# Patient Record
Sex: Male | Born: 1985
Health system: Southern US, Community
[De-identification: ages and names within clinical notes are randomized; demographics above are authoritative.]

## PROBLEM LIST (undated history)

## (undated) ENCOUNTER — Emergency Department (HOSPITAL_BASED_OUTPATIENT_CLINIC_OR_DEPARTMENT_OTHER): Payer: Self-pay

## (undated) DIAGNOSIS — M549 Dorsalgia, unspecified: Secondary | ICD-10-CM

## (undated) DIAGNOSIS — M5410 Radiculopathy, site unspecified: Secondary | ICD-10-CM

## (undated) DIAGNOSIS — T79A0XA Compartment syndrome, unspecified, initial encounter: Secondary | ICD-10-CM

## (undated) DIAGNOSIS — F329 Major depressive disorder, single episode, unspecified: Secondary | ICD-10-CM

## (undated) DIAGNOSIS — L409 Psoriasis, unspecified: Secondary | ICD-10-CM

## (undated) DIAGNOSIS — H3553 Other dystrophies primarily involving the sensory retina: Secondary | ICD-10-CM

## (undated) DIAGNOSIS — F32A Depression, unspecified: Secondary | ICD-10-CM

## (undated) HISTORY — DX: Compartment syndrome, unspecified, initial encounter: T79.A0XA

## (undated) HISTORY — PX: OTHER SURGICAL HISTORY: SHX169

## (undated) HISTORY — PX: BACK SURGERY: SHX140

## (undated) HISTORY — DX: Dorsalgia, unspecified: M54.9

## (undated) HISTORY — DX: Radiculopathy, site unspecified: M54.10

## (undated) HISTORY — PX: HAND SURGERY: SHX662

---
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
## 2006-07-15 ENCOUNTER — Ambulatory Visit: Payer: Self-pay | Admitting: Internal Medicine

## 2006-07-15 LAB — CONVERTED CEMR LAB
Basophils Relative: 0 % (ref 0.0–1.0)
Cholesterol: 112 mg/dL (ref 0–200)
Eosinophils Relative: 2.2 % (ref 0.0–5.0)
Glucose, Bld: 79 mg/dL (ref 70–99)
HCT: 45.5 % (ref 39.0–52.0)
Hemoglobin: 15.9 g/dL (ref 13.0–17.0)
Lymphocytes Relative: 23 % (ref 12.0–46.0)
MCHC: 35 g/dL (ref 30.0–36.0)
MCV: 92 fL (ref 78.0–100.0)
Monocytes Relative: 8.6 % (ref 3.0–11.0)
Neutrophils Relative %: 66.2 % (ref 43.0–77.0)
Platelets: 168 10*3/uL (ref 150–400)
RBC: 4.94 M/uL (ref 4.22–5.81)
RDW: 12 % (ref 11.5–14.6)
TSH: 0.85 microintl units/mL (ref 0.35–5.50)
WBC: 3.5 10*3/uL — ABNORMAL LOW (ref 4.5–10.5)

## 2006-09-30 ENCOUNTER — Ambulatory Visit: Payer: Self-pay | Admitting: Internal Medicine

## 2007-02-16 ENCOUNTER — Encounter: Payer: Self-pay | Admitting: Internal Medicine

## 2007-02-24 ENCOUNTER — Telehealth (INDEPENDENT_AMBULATORY_CARE_PROVIDER_SITE_OTHER): Payer: Self-pay | Admitting: *Deleted

## 2007-03-04 ENCOUNTER — Ambulatory Visit: Payer: Self-pay | Admitting: Internal Medicine

## 2007-03-04 DIAGNOSIS — H3530 Unspecified macular degeneration: Secondary | ICD-10-CM | POA: Insufficient documentation

## 2007-12-01 ENCOUNTER — Telehealth (INDEPENDENT_AMBULATORY_CARE_PROVIDER_SITE_OTHER): Payer: Self-pay | Admitting: *Deleted

## 2009-01-05 ENCOUNTER — Ambulatory Visit: Payer: Self-pay | Admitting: Family Medicine

## 2009-08-01 ENCOUNTER — Encounter: Payer: Self-pay | Admitting: Internal Medicine

## 2009-09-17 ENCOUNTER — Encounter: Payer: Self-pay | Admitting: Internal Medicine

## 2009-11-28 ENCOUNTER — Encounter: Payer: Self-pay | Admitting: Internal Medicine

## 2010-06-25 ENCOUNTER — Telehealth (INDEPENDENT_AMBULATORY_CARE_PROVIDER_SITE_OTHER): Payer: Self-pay | Admitting: *Deleted

## 2010-07-02 NOTE — Letter (Signed)
Summary: onychomycosis, topical Rx--- Dermatology  [facility]   Imported By: Lanelle Bal 12/11/2009 13:54:10  _____________________________________________________________________  External Attachment:    Type:   Image     Comment:   External Document

## 2010-07-02 NOTE — Letter (Signed)
Summary: [facility]  [facility]   Imported By: Lanelle Bal 10/09/2009 09:15:10  _____________________________________________________________________  External Attachment:    Type:   Image     Comment:   External Document

## 2010-07-02 NOTE — Letter (Signed)
Summary: [facility]  [facility]   Imported By: Lanelle Bal 08/14/2009 12:56:15  _____________________________________________________________________  External Attachment:    Type:   Image     Comment:   External Document

## 2010-07-02 NOTE — Letter (Signed)
Summary: [facility]  [facility]   Imported By: Lanelle Bal 12/19/2009 10:25:51  _____________________________________________________________________  External Attachment:    Type:   Image     Comment:   External Document

## 2010-07-03 ENCOUNTER — Encounter: Payer: Self-pay | Admitting: Internal Medicine

## 2010-07-03 ENCOUNTER — Other Ambulatory Visit: Payer: Self-pay | Admitting: Internal Medicine

## 2010-07-03 ENCOUNTER — Encounter (INDEPENDENT_AMBULATORY_CARE_PROVIDER_SITE_OTHER): Payer: BC Managed Care – PPO | Admitting: Internal Medicine

## 2010-07-03 DIAGNOSIS — Z0000 Encounter for general adult medical examination without abnormal findings: Secondary | ICD-10-CM

## 2010-07-03 LAB — CBC WITH DIFFERENTIAL/PLATELET
Basophils Absolute: 0 10*3/uL (ref 0.0–0.1)
Basophils Relative: 0.5 % (ref 0.0–3.0)
Eosinophils Absolute: 0.2 10*3/uL (ref 0.0–0.7)
Eosinophils Relative: 3.5 % (ref 0.0–5.0)
HCT: 46.7 % (ref 39.0–52.0)
Hemoglobin: 16.4 g/dL (ref 13.0–17.0)
Lymphocytes Relative: 21.6 % (ref 12.0–46.0)
Lymphs Abs: 1.2 10*3/uL (ref 0.7–4.0)
MCHC: 35.1 g/dL (ref 30.0–36.0)
MCV: 92.9 fl (ref 78.0–100.0)
Monocytes Absolute: 0.5 10*3/uL (ref 0.1–1.0)
Monocytes Relative: 8.6 % (ref 3.0–12.0)
Neutro Abs: 3.6 10*3/uL (ref 1.4–7.7)
Neutrophils Relative %: 65.8 % (ref 43.0–77.0)
Platelets: 170 10*3/uL (ref 150.0–400.0)
RBC: 5.02 Mil/uL (ref 4.22–5.81)
RDW: 12.7 % (ref 11.5–14.6)
WBC: 5.5 10*3/uL (ref 4.5–10.5)

## 2010-07-03 LAB — BASIC METABOLIC PANEL
BUN: 17 mg/dL (ref 6–23)
CO2: 30 mEq/L (ref 19–32)
Calcium: 9.4 mg/dL (ref 8.4–10.5)
Chloride: 102 mEq/L (ref 96–112)
Creatinine, Ser: 1.3 mg/dL (ref 0.4–1.5)
GFR: 74.04 mL/min (ref >60.00)
Glucose, Bld: 76 mg/dL (ref 70–99)
Potassium: 4 mEq/L (ref 3.5–5.1)
Sodium: 140 mEq/L (ref 135–145)

## 2010-07-03 LAB — CHOLESTEROL, TOTAL: Cholesterol: 126 mg/dL (ref 0–200)

## 2010-07-10 NOTE — Progress Notes (Signed)
Summary: lmom   patient needs CPX  1/24--has appt=2/1  Phone Note Call from Patient Call back at Work Phone [phone]   Caller: Patient Summary of Call: lmom for patient to call back and schedule visit (CPX) with Dr Paz---patient saw Dr Beverely Low in 2010, but has not seen Dr Drue Novel since 2008---we have received paperwork from [facility] and I explained on voice message that Dr Drue Novel would be unable to fill this form out  because patient hasnt been seen in years Initial call taken by: Jerolyn Shin,  June 25, 2010 9:16 AM  Follow-up for Phone Call        has appt for Wed 2/1 at 1:30---took paperwork to Danielle''s desk Follow-up by: Jerolyn Shin,  July 03, 2010 12:24 PM

## 2010-07-10 NOTE — Assessment & Plan Note (Signed)
Summary: PHYSICAL AND PAPERWORK--[phone]//PH   confirmed with pat...   Vital Signs:  Patient profile:   25 year old male Height:      70 inches (177.80 cm) Weight:      175.50 pounds (79.77 kg) BMI:     25.27 Temp:     98.6 degrees F (37.00 degrees C) oral BP sitting:   110 / 68  (left arm) Cuff size:   regular  Vitals Entered By: Lucious Groves CMA (July 03, 2010 1:25 PM) CC: CPX./kb Is Patient Diabetic? No Pain Assessment Patient in pain? no      Comments Not fasting./kb   History of Present Illness: CPX  paperwork for [employer]  for the last 2 months has occasional pain at the sides of the legs  after running. 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 Resp:  Denies cough and wheezing. GI:  Denies bloody stools, diarrhea, and vomiting. GU:  Denies dysuria and hematuria. Psych:  see #2 in the a/p.  Physical Exam  General:  alert, well-developed, and well-nourished.   Neck:  no masses and no thyromegaly.   Lungs:  normal respiratory effort, no intercostal retractions, no accessory muscle use, and normal breath sounds.   Heart:  normal rate, regular rhythm, no murmur, and no gallop.   Abdomen:  soft, non-tender, no distention, no masses, no guarding, and no rigidity.   Extremities:  no  pretibial edema bilaterally  gait normal Knees normal to inspection and palpation Neurologic:  alert & oriented X3 and gait normal.   Psych:  Cognition and judgment appear intact. Alert and cooperative with normal attention span and concentration.     Impression & Recommendations:  Problem # 1:  ROUTINE GENERAL MEDICAL EXAM@HEALTH  CARE FACL (ICD-V70.0)  rec to bring his immunization records, he likely needs a tetanus shot. He will get records from his mother.  counseled about  diet, exercise, STE  has some B LE pain w/ running: stretching, ADVIL   Orders: Venipuncture (60454) TLB-BMP (Basic Metabolic Panel-BMET) (80048-METABOL) TLB-CBC Platelet - w/Differential (85025-CBCD) TLB-TSH (Thyroid Stimulating Hormone) (84443-TSH) TLB-ALT (SGPT) (84460-ALT) TLB-AST (SGOT) (84450-SGOT) TLB-Cholesterol, Total (82465-CHO) Specimen Handling (09811)  Problem # 2:  MACULAR DEGENERATION (ICD-362.50) see PMH, papaers completed asked him about anxiety , depression ----> he admits to some anx/depression d/t poor vision  encouraged to call or look for help whenever he feels symptoms warrant help   Orders Added: 1)  Venipuncture [36415] 2)  TLB-BMP (Basic Metabolic Panel-BMET) [80048-METABOL] 3)  TLB-CBC Platelet - w/Differential [85025-CBCD] 4)  TLB-TSH (Thyroid Stimulating Hormone) [84443-TSH] 5)  TLB-ALT (SGPT) [84460-ALT] 6)  TLB-AST (SGOT) [84450-SGOT] 7)  TLB-Cholesterol, Total [82465-CHO] 8)  Specimen Handling [99000] 9)  Est. Patient age 70-39 [phone]

## 2010-07-18 NOTE — Letter (Signed)
Summary: [facility]  [facility]   Imported By: Maryln Gottron 07/09/2010 15:20:58  _____________________________________________________________________  External Attachment:    Type:   Image     Comment:   External Document

## 2010-10-18 NOTE — Op Note (Signed)
NAME:  Michael George, Michael George NO.:  [id number]   MEDICAL RECORD NO.:  [id number]                   PATIENT TYPE:  OIB   LOCATION:  2853                                 FACILITY:  [facility]   PHYSICIAN:  Dionne Ano. Everlene Other, M.D.         DATE OF BIRTH:  1986/05/13   DATE OF PROCEDURE:  11/04/2003  DATE OF DISCHARGE:  11/04/2003                                 OPERATIVE REPORT   PREOPERATIVE DIAGNOSIS:  Right index finger metacarpal fracture, midshaft in  nature, displaced with malalignment and poor rotation.   POSTOPERATIVE DIAGNOSIS:  Right index finger metacarpal fracture, midshaft  in nature, displaced with malalignment and poor rotation.   PROCEDURE:  1. Open reduction and internal fixation right index finger metacarpal     fracture with intermedullary rod technique.  2. Stress radiography.   SURGEON:  Dionne Ano. Amanda Pea, M.D.   ASSISTANT:  None.   COMPLICATIONS:  None.   ANESTHESIA:  General LMA anesthesia.   TOURNIQUET TIME:  Less than 20 minutes.   ESTIMATED BLOOD LOSS:  Minimal.   INDICATIONS FOR PROCEDURE:  The patient is a very pleasant 25 year old male  who presents for the above-mentioned diagnosis.  I have counseled him in  regards to the risks and benefits of surgery including the risks of  infection, bleeding, anesthesia, damage to normal structures, and failure to  accomplish his intended goals of relieving symptoms and restoring function.  With this in mind, he decided to proceed.  All of his questions have been  encouraged and answered preoperatively.   OPERATIVE FINDINGS:  The patient had a displaced index finger metacarpal  fracture.  He underwent ORIF without difficulty.  There were no complicating  features.  Excellent stability, splay of the fingers and alignment were  accomplished.   OPERATION IN DETAIL:  The patient was seen by myself and anesthesia and  taken to the operative suite and underwent smooth induction of general  LMA  anesthesia.  Ancef was then given IV for antibiotic prophylaxis.  The  patient underwent prep and drape in the usual sterile fashion about the  right upper extremity and following this, a transverse incision was made  under 250 mmHg tourniquet control about the base of the second CMC joint.  I  dissected down and created a pilot drill hole over the [facility] base and once  this was done, I then threaded a 0.062 blunt-end K-wire down the medullary  shaft and across the fracture site in a reduced position and then seated  this without difficulty.  I pre-bent the wire for prevention of angulation  in the apex-dorsal plane and engaged the wire nicely so that the end would  be pointed towards the palm so as not to encroach upon the extensor  apparatus.  Following this, I then checked the reduction multiple times.  It  was excellent.  I was pleased with the finger alignment, stability, and  overall construct.  I fluoro'd the hand to make sure there were no other  fractures.  There was not to fluoroscopy exam in stress views.  Following  this, I then deflated the tourniquet, irrigated copiously, and closed with  interrupted Prolene.  Marcaine without epinephrine was placed in the skin  for postoperative analgesia and the patient was taken to the recovery room.  He tolerated the procedure well.  There were no complicating features.  All  sponge, needle, and instrument counts were reported as correct.  He was  discharged home on Vicodin and Keflex to return to see me in six days in the  office.                                               Dionne Ano. Everlene Other, M.D.    Nash Mantis  D:  11/04/2003  T:  11/05/2003  Job:  [id number]

## 2011-10-26 ENCOUNTER — Encounter (HOSPITAL_COMMUNITY): Payer: Self-pay | Admitting: Emergency Medicine

## 2011-10-26 ENCOUNTER — Emergency Department (HOSPITAL_COMMUNITY)
Admission: EM | Admit: 2011-10-26 | Discharge: 2011-10-26 | Disposition: A | Discharge status: Home / Self Care | Payer: BC Managed Care – PPO | Attending: Emergency Medicine | Admitting: Emergency Medicine

## 2011-10-26 DIAGNOSIS — R110 Nausea: Secondary | ICD-10-CM | POA: Insufficient documentation

## 2011-10-26 DIAGNOSIS — R6884 Jaw pain: Secondary | ICD-10-CM | POA: Insufficient documentation

## 2011-10-26 DIAGNOSIS — Z79899 Other long term (current) drug therapy: Secondary | ICD-10-CM | POA: Insufficient documentation

## 2011-10-26 DIAGNOSIS — R209 Unspecified disturbances of skin sensation: Secondary | ICD-10-CM | POA: Insufficient documentation

## 2011-10-26 DIAGNOSIS — F411 Generalized anxiety disorder: Secondary | ICD-10-CM | POA: Insufficient documentation

## 2011-10-26 DIAGNOSIS — H3553 Other dystrophies primarily involving the sensory retina: Secondary | ICD-10-CM | POA: Insufficient documentation

## 2011-10-26 DIAGNOSIS — R079 Chest pain, unspecified: Secondary | ICD-10-CM | POA: Insufficient documentation

## 2011-10-26 DIAGNOSIS — R52 Pain, unspecified: Secondary | ICD-10-CM | POA: Insufficient documentation

## 2011-10-26 DIAGNOSIS — M25519 Pain in unspecified shoulder: Secondary | ICD-10-CM | POA: Insufficient documentation

## 2011-10-26 HISTORY — DX: Other dystrophies primarily involving the sensory retina: H35.53

## 2011-10-26 HISTORY — DX: Depression, unspecified: F32.A

## 2011-10-26 HISTORY — DX: Major depressive disorder, single episode, unspecified: F32.9

## 2011-10-26 LAB — TROPONIN I
Troponin I: <0.3 ng/mL (ref <0.30)
Troponin I: <0.3 ng/mL (ref <0.30)

## 2011-10-26 MED ORDER — LORAZEPAM 2 MG/ML IJ SOLN
1.0000 mg | Freq: Once | INTRAMUSCULAR | Status: AC
  Administered 2011-10-26: 1 mg via INTRAVENOUS
  Filled 2011-10-26 (×2): qty 1

## 2011-10-26 MED ORDER — SODIUM CHLORIDE 0.9 % IV SOLN
Freq: Once | INTRAVENOUS | Status: AC
  Administered 2011-10-26: 10 mL/h via INTRAVENOUS

## 2011-10-26 MED ORDER — KETOROLAC TROMETHAMINE 30 MG/ML IJ SOLN
30.0000 mg | Freq: Once | INTRAMUSCULAR | Status: AC
  Administered 2011-10-26: 30 mg via INTRAVENOUS
  Filled 2011-10-26 (×2): qty 1

## 2011-10-26 NOTE — Discharge Instructions (Signed)
Pain of Unknown Etiology (Pain Without a Known Cause) You have come to your caregiver because of pain. Pain can occur in any part of the body. Often there is not a definite cause. If your laboratory (blood or urine) work was normal and x-rays or other studies were normal, your caregiver may treat you without knowing the cause of the pain. An example of this is the headache. Most headaches are diagnosed by taking a history. This means your caregiver asks you questions about your headaches. Your caregiver determines a treatment based on your answers. Usually testing done for headaches is normal. Often testing is not done unless there is no response to medications. Regardless of where your pain is located today, you can be given medications to make you comfortable. If no physical cause of pain can be found, most cases of pain will gradually leave as suddenly as they came.  If you have a painful condition and no reason can be found for the pain, It is importantthat you follow up with your caregiver. If the pain becomes worse or does not go away, it may be necessary to repeat tests and look further for a possible cause.  Only take over-the-counter or prescription medicines for pain, discomfort, or fever as directed by your caregiver.   For the protection of your privacy, test results can not be given over the phone. Make sure you receive the results of your test. Ask as to how these results are to be obtained if you have not been informed. It is your responsibility to obtain your test results.   You may continue all activities unless the activities cause more pain. When the pain lessens, it is important to gradually resume normal activities. Resume activities by beginning slowly and gradually increasing the intensity and duration of the activities or exercise. During periods of severe pain, bed-rest may be helpful. Lay or sit in any position that is comfortable.   Ice used for acute (sudden) conditions may be  effective. Use a large plastic bag filled with ice and wrapped in a towel. This may provide pain relief.   See your caregiver for continued problems. They can help or refer you for exercises or physical therapy if necessary.  If you were given medications for your condition, do not drive, operate machinery or power tools, or sign legal documents for 24 hours. Do not drink alcohol, take sleeping pills, or take other medications that may interfere with treatment. See your caregiver immediately if you have pain that is becoming worse and not relieved by medications. Document Released: 02/11/2001 Document Revised: 05/08/2011 Document Reviewed: 05/19/2005 Exit[employer] Patient Information 2012 [employer]. 

## 2011-10-26 NOTE — ED Provider Notes (Signed)
History     CSN: [id number]  Arrival date & time 10/26/11  0051   First MD Initiated Contact with Patient 10/26/11 0112      Chief Complaint  Patient presents with  . Jaw Pain    (Consider location/radiation/quality/duration/timing/severity/associated sxs/prior treatment) HPI This is a 26 year old white male who was taking a shower about an hour and a half ago. He had the sudden onset of pain at the end of his left mandible. The pain is moderate to severe, localized and worse with palpation. He subsequently developed a burning pain in his left chest, and a vague pain in his left shoulder. The pain in his chest is worse with palpation. It is not accompanied by dyspnea. He has had nausea but no vomiting. He states he is very anxious and has some paresthesias of the left arm.  Past Medical History  Diagnosis Date  . Depression   . Stargardt's disease     Past Surgical History  Procedure Date  . Back surgery     No family history on file.  History  Substance Use Topics  . Smoking status: Never Smoker   . Smokeless tobacco: Not on file  . Alcohol Use: No      Review of Systems  All other systems reviewed and are negative.    Allergies  Review of patient's allergies indicates no known allergies.  Home Medications   Current Outpatient Rx  Name Route Sig Dispense Refill  . BUPROPION HCL ER (SR) 150 MG PO TB12 Oral Take 150 mg by mouth daily.    . DULOXETINE HCL 30 MG PO CPEP Oral Take 30 mg by mouth daily.      BP 115/82  Pulse 76  Temp(Src) 98 F (36.7 C) (Oral)  Resp 16  Wt 175 lb (79.379 kg)  SpO2 100%  Physical Exam General: Well-developed, well-nourished male in no acute distress; appearance consistent with age of record HENT: normocephalic, atraumatic; moderate to severe point tenderness at the left mandibular angle without deformity, swelling orerythema Eyes: pupils equal round and reactive to light; extraocular muscles intact Neck: supple Heart:  regular rate and rhythm Lungs: clear to auscultation bilaterally Chest: Moderate to severe left parasternal tenderness Abdomen: soft; nondistended Extremities: No deformity; full range of motion; pulses normal; minimal left shoulder tenderness Neurologic: Awake, alert and oriented; motor function intact in all extremities and symmetric; no facial droop Skin: Warm and dry Psychiatric: Anxious    ED Course  Procedures (including critical care time)     MDM   Nursing notes and vitals signs, including pulse oximetry, reviewed.  Summary of this visit's results, reviewed by myself:  Labs:  Results for orders placed during the hospital encounter of 10/26/11  TROPONIN I      Component Value Range   Troponin I <0.30  <0.30 (ng/mL)  TROPONIN I      Component Value Range   Troponin I <0.30  <0.30 (ng/mL)     EKG Interpretation:  Date & Time: 10/26/2011 1:08 AM  Rate: 79  Rhythm: normal sinus rhythm  QRS Axis: right  Intervals: normal  ST/T Wave abnormalities: normal  Conduction Disutrbances:none  Narrative Interpretation: Q waves anterolaterally  Old EKG Reviewed: none available  3:11 AM Pain improved after IV Toradol and Ativan.  4:19 AM Continues to improve. Source of pain is not clear at this time. He was advised to return for worsening or changing symptoms.       Hanley Seamen, MD 10/26/11 [phone]

## 2011-10-26 NOTE — ED Notes (Signed)
Patient is alert and oriented x3.  He was given DC instructions and follow up visit instructions.  Patient gave verbal understanding.  He was DC ambulatory under his own power to home.  V/S stable.  He was not showing any signs of distress on DC 

## 2011-10-26 NOTE — ED Notes (Signed)
Pt alert, nad, c/o left chest pain, radiated to left jaw, c/o nausea, onset this evening, denies hx, resp even unlabored, skin pwd

## 2011-10-26 NOTE — ED Notes (Signed)
MD at bedside. 

## 2011-11-04 ENCOUNTER — Ambulatory Visit (INDEPENDENT_AMBULATORY_CARE_PROVIDER_SITE_OTHER): Payer: BC Managed Care – PPO | Admitting: Internal Medicine

## 2011-11-04 ENCOUNTER — Encounter: Payer: Self-pay | Admitting: Internal Medicine

## 2011-11-04 ENCOUNTER — Ambulatory Visit (INDEPENDENT_AMBULATORY_CARE_PROVIDER_SITE_OTHER)
Admission: RE | Admit: 2011-11-04 | Discharge: 2011-11-04 | Disposition: A | Discharge status: Home / Self Care | Payer: BC Managed Care – PPO | Source: Ambulatory Visit | Attending: Internal Medicine | Admitting: Internal Medicine

## 2011-11-04 VITALS — BP 112/78 | HR 64 | Temp 97.7°F | Wt 179.0 lb

## 2011-11-04 DIAGNOSIS — R0789 Other chest pain: Secondary | ICD-10-CM

## 2011-11-04 DIAGNOSIS — R079 Chest pain, unspecified: Secondary | ICD-10-CM

## 2011-11-04 NOTE — Assessment & Plan Note (Addendum)
Atypical chest pain, review of systems not pointing to any specific etiology. He is tender at the epigastrium on exam. EKG today with no acute changes and no evidence of pericarditis. Plan: PPIs trial, CXR Patient to call in 2 weeks if not better,  call anytime if symptoms increase

## 2011-11-04 NOTE — Progress Notes (Signed)
  Subjective:    Patient ID: Michael George, male    DOB: September 12, 1985, 26 y.o.   MRN: [id number]  HPI ER followup Michael George to the ER 10/26/2011 with acute onset of left mandible and left chest pain. Troponin was negative, EKG with no acute changes. the jaw pain essentially is gone. He continue with chest pain, it is left-sided, no radiation, frequent episodes that last few minutes throughout the day, no clear-cut triggers specifically no worse by bending his torso or eating. There is no associated nausea, sweats, shortness of breath. In the ER he was also anxious but mostly because the symptoms.   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 No recent dental pain or dental treatments. Denies actual abdominal pain, GERD symptoms. No  rash at the chest No recent airplane trips,no pain or swelling @ calves    Objective:   Physical Exam  General -- alert, well-developed, and well-nourished.   Neck --no thyromegaly  Lungs -- normal respiratory effort, no intercostal retractions, no accessory muscle use, and normal breath sounds.   Heart-- normal rate, regular rhythm, no murmur, and no gallop. Chest wall nontender to palpation   Abdomen-- not distended, soft, slightly tender in the epigastric area without mass or rebound Extremities-- no pretibial edema bilaterally, calve symmetric Neurologic-- alert & oriented X3 and strength normal in all extremities. Psych-- Cognition and judgment  appear intact. Alert and cooperative with normal attention span and concentration.  not anxious appearing and not depressed appearing.       Assessment & Plan:

## 2011-11-04 NOTE — Patient Instructions (Signed)
Please get your x-ray at the other [place]  office located at: [address] [place], across from [facility].  Please go to the basement, this is a walk-in facility, they are open from 8:30 to 5:30 PM. Phone number [phone]. ----- Take over-the-counter Prilosec 20 mg one before breakfast for the next 2 weeks Call anytime if the symptoms increase or if you are not better in 2 weeks

## 2011-11-11 ENCOUNTER — Encounter: Payer: Self-pay | Admitting: *Deleted

## 2012-06-02 DIAGNOSIS — L409 Psoriasis, unspecified: Secondary | ICD-10-CM

## 2012-06-02 HISTORY — DX: Psoriasis, unspecified: L40.9

## 2012-06-18 ENCOUNTER — Encounter (HOSPITAL_BASED_OUTPATIENT_CLINIC_OR_DEPARTMENT_OTHER): Payer: Self-pay | Admitting: Emergency Medicine

## 2012-06-18 ENCOUNTER — Emergency Department (HOSPITAL_BASED_OUTPATIENT_CLINIC_OR_DEPARTMENT_OTHER)
Admission: EM | Admit: 2012-06-18 | Discharge: 2012-06-18 | Disposition: A | Discharge status: Home / Self Care | Payer: BC Managed Care – PPO | Attending: Emergency Medicine | Admitting: Emergency Medicine

## 2012-06-18 ENCOUNTER — Emergency Department (HOSPITAL_BASED_OUTPATIENT_CLINIC_OR_DEPARTMENT_OTHER): Payer: BC Managed Care – PPO

## 2012-06-18 DIAGNOSIS — Y93B9 Activity, other involving muscle strengthening exercises: Secondary | ICD-10-CM | POA: Insufficient documentation

## 2012-06-18 DIAGNOSIS — S62329A Displaced fracture of shaft of unspecified metacarpal bone, initial encounter for closed fracture: Secondary | ICD-10-CM | POA: Insufficient documentation

## 2012-06-18 DIAGNOSIS — W1809XA Striking against other object with subsequent fall, initial encounter: Secondary | ICD-10-CM | POA: Insufficient documentation

## 2012-06-18 DIAGNOSIS — Z872 Personal history of diseases of the skin and subcutaneous tissue: Secondary | ICD-10-CM | POA: Insufficient documentation

## 2012-06-18 DIAGNOSIS — Z79899 Other long term (current) drug therapy: Secondary | ICD-10-CM | POA: Insufficient documentation

## 2012-06-18 DIAGNOSIS — Y9239 Other specified sports and athletic area as the place of occurrence of the external cause: Secondary | ICD-10-CM | POA: Insufficient documentation

## 2012-06-18 DIAGNOSIS — Z8669 Personal history of other diseases of the nervous system and sense organs: Secondary | ICD-10-CM | POA: Insufficient documentation

## 2012-06-18 DIAGNOSIS — F329 Major depressive disorder, single episode, unspecified: Secondary | ICD-10-CM | POA: Insufficient documentation

## 2012-06-18 DIAGNOSIS — F3289 Other specified depressive episodes: Secondary | ICD-10-CM | POA: Insufficient documentation

## 2012-06-18 DIAGNOSIS — S62309A Unspecified fracture of unspecified metacarpal bone, initial encounter for closed fracture: Secondary | ICD-10-CM

## 2012-06-18 HISTORY — DX: Psoriasis, unspecified: L40.9

## 2012-06-18 MED ORDER — HYDROCODONE-ACETAMINOPHEN 5-325 MG PO TABS: 2.0000 | ORAL_TABLET | ORAL | Status: DC | PRN

## 2012-06-18 NOTE — ED Provider Notes (Signed)
History     CSN: [id number]  Arrival date & time 06/18/12  2027   First MD Initiated Contact with Patient 06/18/12 2106      Chief Complaint  Patient presents with  . Hand Injury    (Consider location/radiation/quality/duration/timing/severity/associated sxs/prior treatment) HPI Comments: Patient comes to the ER for evaluation of left hand injury. Patient reports that he was working out at [employer] when injury occurred. He was working on an exercise ball and slipped, hitting his hand on the ground. Patient says he heard a pop. He thinks that the fingers. Patient is complaining of pain around the base of the left ring finger. It hurts to move the hand or finger.  Patient is a 27 y.o. male presenting with hand injury.  Hand Injury     Past Medical History  Diagnosis Date  . Depression   . Stargardt's disease   . Psoriasis     Past Surgical History  Procedure Date  . Back surgery     No family history on file.  History  Substance Use Topics  . Smoking status: Never Smoker   . Smokeless tobacco: Not on file  . Alcohol Use: No      Review of Systems  Musculoskeletal:       Hand pain    Allergies  Review of patient's allergies indicates no known allergies.  Home Medications   Current Outpatient Rx  Name  Route  Sig  Dispense  Refill  . BUPROPION HCL ER (SR) 150 MG PO TB12   Oral   Take 150 mg by mouth daily.         . DULOXETINE HCL 30 MG PO CPEP   Oral   Take 30 mg by mouth daily.           BP 106/63  Temp 98.3 F (36.8 C) (Oral)  Resp 16  Ht 6' (1.829 m)  Wt 178 lb (80.74 kg)  BMI 24.14 kg/m2  SpO2 100%  Physical Exam  Musculoskeletal:       Left hand: He exhibits tenderness and swelling. He exhibits no deformity and no laceration. normal sensation noted. Normal strength noted.       Hands: Neurological: He has normal strength. No sensory deficit.  Skin: Skin is warm and intact.    ED Course  Procedures (including critical care  time)  Labs Reviewed - No data to display Dg Hand Complete Left  06/18/2012  *RADIOLOGY REPORT*  Clinical Data: Pain and difficulty moving left ring finger after fall.  LEFT HAND - COMPLETE 3+ VIEW  Comparison: None.  Findings: Spiral oblique fracture of the mid shaft of the left fourth metacarpal bone.  No significant displacement.  No additional fractures are demonstrated.  No focal bone lesion or bone destruction.  No radiopaque soft tissue foreign bodies.  IMPRESSION: Spiral oblique nondisplaced fracture of the left fourth metacarpal bone.   Original Report Authenticated By: Burman Nieves, M.D.      Diagnosis: Left fourth metacarpal fracture    MDM  Patient presents to ER with complaints of pain in the hand after a fall. Pain is in the area of the fourth metacarpal region. X-ray confirms a spiral fracture of the fourth metacarpal. Patient placed in a splint and will follow up with orthopedics.        Gilda Crease, MD 06/18/12 2150

## 2012-06-18 NOTE — ED Notes (Signed)
Left hand injured while working out at gym.

## 2012-06-18 NOTE — ED Notes (Signed)
MD at bedside. 

## 2013-02-18 ENCOUNTER — Encounter (HOSPITAL_BASED_OUTPATIENT_CLINIC_OR_DEPARTMENT_OTHER): Payer: Self-pay | Admitting: *Deleted

## 2013-02-18 ENCOUNTER — Emergency Department (HOSPITAL_BASED_OUTPATIENT_CLINIC_OR_DEPARTMENT_OTHER)
Admission: EM | Admit: 2013-02-18 | Discharge: 2013-02-19 | Disposition: A | Discharge status: Home / Self Care | Payer: BC Managed Care – PPO | Attending: Emergency Medicine | Admitting: Emergency Medicine

## 2013-02-18 DIAGNOSIS — N471 Phimosis: Secondary | ICD-10-CM | POA: Insufficient documentation

## 2013-02-18 DIAGNOSIS — Z8659 Personal history of other mental and behavioral disorders: Secondary | ICD-10-CM | POA: Insufficient documentation

## 2013-02-18 DIAGNOSIS — Z872 Personal history of diseases of the skin and subcutaneous tissue: Secondary | ICD-10-CM | POA: Insufficient documentation

## 2013-02-18 DIAGNOSIS — R5381 Other malaise: Secondary | ICD-10-CM | POA: Insufficient documentation

## 2013-02-18 DIAGNOSIS — N390 Urinary tract infection, site not specified: Secondary | ICD-10-CM | POA: Insufficient documentation

## 2013-02-18 DIAGNOSIS — N478 Other disorders of prepuce: Secondary | ICD-10-CM | POA: Insufficient documentation

## 2013-02-18 DIAGNOSIS — Z8669 Personal history of other diseases of the nervous system and sense organs: Secondary | ICD-10-CM | POA: Insufficient documentation

## 2013-02-18 LAB — BASIC METABOLIC PANEL
Calcium: 10.1 mg/dL (ref 8.4–10.5)
GFR calc Af Amer: 86 mL/min — ABNORMAL LOW (ref >90)
GFR calc non Af Amer: 74 mL/min — ABNORMAL LOW (ref >90)
Glucose, Bld: 110 mg/dL — ABNORMAL HIGH (ref 70–99)
Sodium: 137 mEq/L (ref 135–145)

## 2013-02-18 LAB — CBC WITH DIFFERENTIAL/PLATELET
Basophils Absolute: 0 10*3/uL (ref 0.0–0.1)
Basophils Relative: 0 % (ref 0–1)
Eosinophils Absolute: 0 10*3/uL (ref 0.0–0.7)
Eosinophils Relative: 0 % (ref 0–5)
Lymphs Abs: 0.7 10*3/uL (ref 0.7–4.0)
MCH: 31.8 pg (ref 26.0–34.0)
MCHC: 35.7 g/dL (ref 30.0–36.0)
MCV: 89.1 fL (ref 78.0–100.0)
Neutrophils Relative %: 86 % — ABNORMAL HIGH (ref 43–77)
Platelets: 172 10*3/uL (ref 150–400)
RDW: 12.7 % (ref 11.5–15.5)

## 2013-02-18 LAB — URINALYSIS, ROUTINE W REFLEX MICROSCOPIC
Bilirubin Urine: NEGATIVE
Nitrite: POSITIVE — AB
Protein, ur: NEGATIVE mg/dL
Specific Gravity, Urine: 1.012 (ref 1.005–1.030)
Urobilinogen, UA: 1 mg/dL (ref 0.0–1.0)

## 2013-02-18 MED ORDER — CEPHALEXIN 500 MG PO CAPS: 500.0000 mg | ORAL_CAPSULE | Freq: Four times a day (QID) | ORAL | Status: DC

## 2013-02-18 MED ORDER — SODIUM CHLORIDE 0.9 % IV BOLUS (SEPSIS)
1000.0000 mL | Freq: Once | INTRAVENOUS | Status: AC
  Administered 2013-02-18: 1000 mL via INTRAVENOUS

## 2013-02-18 MED ORDER — ONDANSETRON HCL 4 MG/2ML IJ SOLN
4.0000 mg | Freq: Once | INTRAMUSCULAR | Status: AC
  Administered 2013-02-18: 4 mg via INTRAVENOUS
  Filled 2013-02-18: qty 2

## 2013-02-18 MED ORDER — DEXTROSE 5 % IV SOLN
1.0000 g | Freq: Once | INTRAVENOUS | Status: AC
  Administered 2013-02-18: 1 g via INTRAVENOUS
  Filled 2013-02-18: qty 10

## 2013-02-18 MED ORDER — ONDANSETRON HCL 4 MG PO TABS: 4.0000 mg | ORAL_TABLET | Freq: Four times a day (QID) | ORAL | Status: DC

## 2013-02-18 NOTE — ED Notes (Signed)
Pt c/o painful freq urination pt also c/o generalized weakness

## 2013-02-18 NOTE — ED Provider Notes (Addendum)
CSN: [id number]     Arrival date & time 02/18/13  2102 History  This chart was scribed for Michael George, by Clydene Laming, ED Scribe. This patient was seen in room MH01/MH01 and the patient's care was started at 10:41 PM.   Chief Complaint  Patient presents with  . Dysuria    The history is provided by the patient. No language interpreter was used.   HPI Comments: Michael George is a 27 y.o. male who presents to the Emergency Department complaining of frequent dysuria onset this morning with associated weakness. Pt expresses testicle soreness, but no unilateral pain, swellling redness or mass. Pt denies nausea , emesis,  genitourinary swelling or pain, and discharge.   Past Medical History  Diagnosis Date  . Depression   . Stargardt's disease   . Psoriasis    Past Surgical History  Procedure Laterality Date  . Back surgery     History reviewed. No pertinent family history. History  Substance Use Topics  . Smoking status: Never Smoker   . Smokeless tobacco: Not on file  . Alcohol Use: No    Review of Systems  Constitutional: Negative for fever and diaphoresis.  Genitourinary: Positive for dysuria and frequency.  All other systems reviewed and are negative.    Allergies  Review of patient's allergies indicates no known allergies.  Home Medications  No current outpatient prescriptions on file. Triage Vitals: BP 126/78  Pulse 86  Temp(Src) 98.7 F (37.1 C) (Oral)  Resp 16  Ht 6' (1.829 m)  Wt 178 lb (80.74 kg)  BMI 24.14 kg/m2  SpO2 100% Physical Exam  Constitutional: He is oriented to person, place, and time. He appears well-developed and well-nourished. No distress.  HENT:  Head: Normocephalic and atraumatic.  Right Ear: Hearing normal.  Left Ear: Hearing normal.  Nose: Nose normal.  Mouth/Throat: Oropharynx is clear and moist and mucous membranes are normal.  Eyes: Conjunctivae and EOM are normal. Pupils are equal, round, and reactive to light.   Neck: Normal range of motion. Neck supple.  Cardiovascular: Regular rhythm, S1 normal and S2 normal.  Exam reveals no gallop and no friction rub.   No murmur heard. Pulmonary/Chest: Effort normal and breath sounds normal. No respiratory distress. He exhibits no tenderness.  Abdominal: Soft. Normal appearance and bowel sounds are normal. There is no hepatosplenomegaly. There is tenderness. There is no rebound, no guarding, no tenderness at McBurney's point and negative Murphy's sign. No hernia. Hernia confirmed negative in the right inguinal area and confirmed negative in the left inguinal area.  Genitourinary: Testes normal. Right testis shows no mass. Left testis shows no mass. Uncircumcised. Phimosis present. No paraphimosis. No discharge found.  Musculoskeletal: Normal range of motion.  Neurological: He is alert and oriented to person, place, and time. He has normal strength. No cranial nerve deficit or sensory deficit. Coordination normal. GCS eye subscore is 4. GCS verbal subscore is 5. GCS motor subscore is 6.  Skin: Skin is warm, dry and intact. No rash noted. No cyanosis.  Psychiatric: He has a normal mood and affect. His speech is normal and behavior is normal. Thought content normal.    ED Course  Procedures (including critical care time)  DIAGNOSTIC STUDIES: Oxygen Saturation is 100% on RA, normal by my interpretation.    COORDINATION OF CARE: 10:45 PM- Discussed treatment plan with pt at bedside. Pt verbalized understanding and agreement with plan.   Labs Review Labs Reviewed  URINALYSIS, ROUTINE W REFLEX MICROSCOPIC - Abnormal;  Notable for the following:    Color, Urine ORANGE (*)    APPearance CLOUDY (*)    Hgb urine dipstick MODERATE (*)    Nitrite POSITIVE (*)    Leukocytes, UA LARGE (*)    All other components within normal limits  URINE MICROSCOPIC-ADD ON - Abnormal; Notable for the following:    Bacteria, UA FEW (*)    All other components within normal limits   URINE CULTURE   Imaging Review No results found.  MDM  Diagnosis: Urinary tract infection, possible early pyelonephritis  Presents for evaluation of painful frequent urination. He has been feeling nausea without vomiting and has had generalized weakness. Symptoms began this morning. Urinalysis shows obvious signs of infection. Patient's vital signs are all normal including no fever. Patient was hydrated here in the ER and administered IV Rocephin. He'll be continued on Keflex. UTI is likely a result of phimosis, will refer to urology. Return if his symptoms worsen, especially recurrent vomiting causing him inability to hold down his medications.  I personally performed the services described in this documentation, which was scribed in my presence. The recorded information has been reviewed and is accurate.    Michael Crease, MD 02/18/13 7829  Michael Crease, MD 02/18/13 2345  Michael Crease, MD 02/19/13 [phone]

## 2013-02-21 LAB — URINE CULTURE: Colony Count: >=100000

## 2014-06-02 DIAGNOSIS — T79A0XA Compartment syndrome, unspecified, initial encounter: Secondary | ICD-10-CM

## 2014-06-02 HISTORY — DX: Compartment syndrome, unspecified, initial encounter: T79.A0XA

## 2015-06-03 DIAGNOSIS — M5410 Radiculopathy, site unspecified: Secondary | ICD-10-CM

## 2015-06-03 DIAGNOSIS — M549 Dorsalgia, unspecified: Secondary | ICD-10-CM

## 2015-06-03 HISTORY — DX: Dorsalgia, unspecified: M54.9

## 2015-06-03 HISTORY — DX: Radiculopathy, site unspecified: M54.10

## 2016-07-14 DIAGNOSIS — M546 Pain in thoracic spine: Secondary | ICD-10-CM | POA: Diagnosis not present

## 2016-07-14 DIAGNOSIS — M62830 Muscle spasm of back: Secondary | ICD-10-CM | POA: Diagnosis not present

## 2016-07-14 DIAGNOSIS — M542 Cervicalgia: Secondary | ICD-10-CM | POA: Diagnosis not present

## 2016-07-14 DIAGNOSIS — M545 Low back pain: Secondary | ICD-10-CM | POA: Diagnosis not present

## 2016-07-21 DIAGNOSIS — M542 Cervicalgia: Secondary | ICD-10-CM | POA: Diagnosis not present

## 2016-07-21 DIAGNOSIS — M62830 Muscle spasm of back: Secondary | ICD-10-CM | POA: Diagnosis not present

## 2016-07-21 DIAGNOSIS — M546 Pain in thoracic spine: Secondary | ICD-10-CM | POA: Diagnosis not present

## 2016-07-21 DIAGNOSIS — M545 Low back pain: Secondary | ICD-10-CM | POA: Diagnosis not present

## 2016-07-28 DIAGNOSIS — M542 Cervicalgia: Secondary | ICD-10-CM | POA: Diagnosis not present

## 2016-07-28 DIAGNOSIS — M62830 Muscle spasm of back: Secondary | ICD-10-CM | POA: Diagnosis not present

## 2016-07-28 DIAGNOSIS — M546 Pain in thoracic spine: Secondary | ICD-10-CM | POA: Diagnosis not present

## 2016-07-28 DIAGNOSIS — M545 Low back pain: Secondary | ICD-10-CM | POA: Diagnosis not present

## 2016-08-04 DIAGNOSIS — M542 Cervicalgia: Secondary | ICD-10-CM | POA: Diagnosis not present

## 2016-08-04 DIAGNOSIS — M546 Pain in thoracic spine: Secondary | ICD-10-CM | POA: Diagnosis not present

## 2016-08-04 DIAGNOSIS — M62830 Muscle spasm of back: Secondary | ICD-10-CM | POA: Diagnosis not present

## 2016-08-04 DIAGNOSIS — M545 Low back pain: Secondary | ICD-10-CM | POA: Diagnosis not present

## 2016-08-11 DIAGNOSIS — M545 Low back pain: Secondary | ICD-10-CM | POA: Diagnosis not present

## 2016-08-11 DIAGNOSIS — M62830 Muscle spasm of back: Secondary | ICD-10-CM | POA: Diagnosis not present

## 2016-08-11 DIAGNOSIS — M546 Pain in thoracic spine: Secondary | ICD-10-CM | POA: Diagnosis not present

## 2016-08-11 DIAGNOSIS — M542 Cervicalgia: Secondary | ICD-10-CM | POA: Diagnosis not present

## 2016-08-12 DIAGNOSIS — L649 Androgenic alopecia, unspecified: Secondary | ICD-10-CM | POA: Diagnosis not present

## 2016-08-12 DIAGNOSIS — L400 Psoriasis vulgaris: Secondary | ICD-10-CM | POA: Diagnosis not present

## 2016-08-18 DIAGNOSIS — M546 Pain in thoracic spine: Secondary | ICD-10-CM | POA: Diagnosis not present

## 2016-08-18 DIAGNOSIS — M62830 Muscle spasm of back: Secondary | ICD-10-CM | POA: Diagnosis not present

## 2016-08-18 DIAGNOSIS — M542 Cervicalgia: Secondary | ICD-10-CM | POA: Diagnosis not present

## 2016-08-18 DIAGNOSIS — M545 Low back pain: Secondary | ICD-10-CM | POA: Diagnosis not present

## 2016-08-25 DIAGNOSIS — M546 Pain in thoracic spine: Secondary | ICD-10-CM | POA: Diagnosis not present

## 2016-08-25 DIAGNOSIS — M542 Cervicalgia: Secondary | ICD-10-CM | POA: Diagnosis not present

## 2016-08-25 DIAGNOSIS — M62830 Muscle spasm of back: Secondary | ICD-10-CM | POA: Diagnosis not present

## 2016-08-25 DIAGNOSIS — M545 Low back pain: Secondary | ICD-10-CM | POA: Diagnosis not present

## 2016-09-01 DIAGNOSIS — M546 Pain in thoracic spine: Secondary | ICD-10-CM | POA: Diagnosis not present

## 2016-09-01 DIAGNOSIS — M545 Low back pain: Secondary | ICD-10-CM | POA: Diagnosis not present

## 2016-09-01 DIAGNOSIS — M542 Cervicalgia: Secondary | ICD-10-CM | POA: Diagnosis not present

## 2016-09-01 DIAGNOSIS — M62830 Muscle spasm of back: Secondary | ICD-10-CM | POA: Diagnosis not present

## 2016-09-08 DIAGNOSIS — M545 Low back pain: Secondary | ICD-10-CM | POA: Diagnosis not present

## 2016-09-08 DIAGNOSIS — M542 Cervicalgia: Secondary | ICD-10-CM | POA: Diagnosis not present

## 2016-09-08 DIAGNOSIS — M546 Pain in thoracic spine: Secondary | ICD-10-CM | POA: Diagnosis not present

## 2016-09-08 DIAGNOSIS — M62830 Muscle spasm of back: Secondary | ICD-10-CM | POA: Diagnosis not present

## 2016-09-15 DIAGNOSIS — M542 Cervicalgia: Secondary | ICD-10-CM | POA: Diagnosis not present

## 2016-09-15 DIAGNOSIS — M545 Low back pain: Secondary | ICD-10-CM | POA: Diagnosis not present

## 2016-09-15 DIAGNOSIS — M546 Pain in thoracic spine: Secondary | ICD-10-CM | POA: Diagnosis not present

## 2016-09-15 DIAGNOSIS — M62830 Muscle spasm of back: Secondary | ICD-10-CM | POA: Diagnosis not present

## 2016-09-22 DIAGNOSIS — M546 Pain in thoracic spine: Secondary | ICD-10-CM | POA: Diagnosis not present

## 2016-09-22 DIAGNOSIS — M545 Low back pain: Secondary | ICD-10-CM | POA: Diagnosis not present

## 2016-09-22 DIAGNOSIS — M542 Cervicalgia: Secondary | ICD-10-CM | POA: Diagnosis not present

## 2016-09-22 DIAGNOSIS — M62830 Muscle spasm of back: Secondary | ICD-10-CM | POA: Diagnosis not present

## 2016-10-20 DIAGNOSIS — H3553 Other dystrophies primarily involving the sensory retina: Secondary | ICD-10-CM | POA: Diagnosis not present

## 2017-08-31 DIAGNOSIS — L648 Other androgenic alopecia: Secondary | ICD-10-CM | POA: Diagnosis not present

## 2017-08-31 DIAGNOSIS — L400 Psoriasis vulgaris: Secondary | ICD-10-CM | POA: Diagnosis not present

## 2017-08-31 DIAGNOSIS — L7211 Pilar cyst: Secondary | ICD-10-CM | POA: Diagnosis not present

## 2017-09-10 DIAGNOSIS — L648 Other androgenic alopecia: Secondary | ICD-10-CM | POA: Diagnosis not present

## 2017-09-10 DIAGNOSIS — Z79899 Other long term (current) drug therapy: Secondary | ICD-10-CM | POA: Diagnosis not present

## 2017-09-24 DIAGNOSIS — H3550 Unspecified hereditary retinal dystrophy: Secondary | ICD-10-CM | POA: Diagnosis not present

## 2017-10-23 DIAGNOSIS — M5413 Radiculopathy, cervicothoracic region: Secondary | ICD-10-CM | POA: Diagnosis not present

## 2017-10-23 DIAGNOSIS — M545 Low back pain: Secondary | ICD-10-CM | POA: Diagnosis not present

## 2017-10-23 DIAGNOSIS — M5417 Radiculopathy, lumbosacral region: Secondary | ICD-10-CM | POA: Diagnosis not present

## 2017-10-23 DIAGNOSIS — M62830 Muscle spasm of back: Secondary | ICD-10-CM | POA: Diagnosis not present

## 2017-10-27 DIAGNOSIS — M545 Low back pain: Secondary | ICD-10-CM | POA: Diagnosis not present

## 2017-10-27 DIAGNOSIS — M5417 Radiculopathy, lumbosacral region: Secondary | ICD-10-CM | POA: Diagnosis not present

## 2017-10-27 DIAGNOSIS — M62830 Muscle spasm of back: Secondary | ICD-10-CM | POA: Diagnosis not present

## 2017-10-27 DIAGNOSIS — M5413 Radiculopathy, cervicothoracic region: Secondary | ICD-10-CM | POA: Diagnosis not present

## 2017-10-28 DIAGNOSIS — M545 Low back pain: Secondary | ICD-10-CM | POA: Diagnosis not present

## 2017-10-28 DIAGNOSIS — M62830 Muscle spasm of back: Secondary | ICD-10-CM | POA: Diagnosis not present

## 2017-10-28 DIAGNOSIS — M5413 Radiculopathy, cervicothoracic region: Secondary | ICD-10-CM | POA: Diagnosis not present

## 2017-10-28 DIAGNOSIS — M5417 Radiculopathy, lumbosacral region: Secondary | ICD-10-CM | POA: Diagnosis not present

## 2017-10-29 DIAGNOSIS — M545 Low back pain: Secondary | ICD-10-CM | POA: Diagnosis not present

## 2017-10-29 DIAGNOSIS — M5413 Radiculopathy, cervicothoracic region: Secondary | ICD-10-CM | POA: Diagnosis not present

## 2017-10-29 DIAGNOSIS — M62830 Muscle spasm of back: Secondary | ICD-10-CM | POA: Diagnosis not present

## 2017-10-29 DIAGNOSIS — M5417 Radiculopathy, lumbosacral region: Secondary | ICD-10-CM | POA: Diagnosis not present

## 2017-12-16 DIAGNOSIS — R4589 Other symptoms and signs involving emotional state: Secondary | ICD-10-CM | POA: Diagnosis not present

## 2018-01-07 DIAGNOSIS — H5052 Exophoria: Secondary | ICD-10-CM | POA: Diagnosis not present

## 2018-01-07 DIAGNOSIS — H353122 Nonexudative age-related macular degeneration, left eye, intermediate dry stage: Secondary | ICD-10-CM | POA: Diagnosis not present

## 2018-03-15 DIAGNOSIS — L400 Psoriasis vulgaris: Secondary | ICD-10-CM | POA: Diagnosis not present

## 2018-03-15 DIAGNOSIS — L648 Other androgenic alopecia: Secondary | ICD-10-CM | POA: Diagnosis not present

## 2018-03-26 ENCOUNTER — Ambulatory Visit: Payer: BLUE CROSS/BLUE SHIELD | Admitting: Family Medicine

## 2018-03-26 ENCOUNTER — Encounter: Payer: Self-pay | Admitting: Family Medicine

## 2018-03-26 VITALS — BP 104/68 | HR 63 | Temp 98.2°F | Ht 72.0 in | Wt 206.1 lb

## 2018-03-26 DIAGNOSIS — K5900 Constipation, unspecified: Secondary | ICD-10-CM | POA: Diagnosis not present

## 2018-03-26 NOTE — Patient Instructions (Signed)
Try MiraLAX 1-2 times daily over the next 3-4 days. If no improvement, try using an enema. Stay well hydrated and keep lots of fiber in your diet.  Call on Monday if no improvement. We will order the X-ray at that time.  Let us know if you need anything.

## 2018-03-26 NOTE — Progress Notes (Signed)
Chief Complaint  Patient presents with  . Pain    bowel pain for one week    Subjective: Patient is a 32 y.o. male here for bowel pain.  1 week. LLQ abd pain and in rectal area that comes and goes. Lasts for around 1-2 hrs. Sharp and crampy pain. BM's have been normal and he is passing gas. Nothing he notices makes the pain better. Nothing he notices exacerbates the pain. Eating/drinking does not affect it. Has not happened every day. Denies fevers, bleeding, dietary change, recent travel, med changes.   ROS:  GI: As noted in HPI Const: no fevers  Past Medical History:  Diagnosis Date  . Back pain 2017   L4-5 transforaminal epidural injection on 07/20/2015  . Compartment syndrome (HCC) 2016  . Depression   . Psoriasis 2014  . Radiculitis 2017   L side, good partial benefit after epidural steriod inj  . Stargardt's disease     Objective: BP 104/68 (BP Location: Left Arm, Patient Position: Sitting, Cuff Size: Normal)   Pulse 63   Temp 98.2 F (36.8 C) (Oral)   Ht 6' (1.829 m)   Wt 206 lb 2 oz (93.5 kg)   SpO2 93%   BMI 27.96 kg/m  General: Awake, appears stated age Abd: BS hypoactive, S, ND, ttp in LLQ, neg Murphy's, Rovsing's, McBurney's, Carnett's Heart: RRR, no murmurs Lungs: CTAB, no rales, wheezes or rhonchi. No accessory muscle use Psych: Age appropriate judgment and insight, normal affect and mood  Assessment and Plan: Constipation, unspecified constipation type  MiraLax bid for 3 d, enema if no better. If no improvement after this, will ck XR and consider rectal exam.  F/u for CPE at earliest convenience. The patient voiced understanding and agreement to the plan.  Jilda Roche [place], DO 03/26/18  3:35 PM

## 2018-03-26 NOTE — Progress Notes (Signed)
Pre visit review using our clinic review tool, if applicable. No additional management support is needed unless otherwise documented below in the visit note. 

## 2018-05-03 DIAGNOSIS — M542 Cervicalgia: Secondary | ICD-10-CM | POA: Diagnosis not present

## 2018-05-03 DIAGNOSIS — M545 Low back pain: Secondary | ICD-10-CM | POA: Diagnosis not present

## 2018-05-03 DIAGNOSIS — M62830 Muscle spasm of back: Secondary | ICD-10-CM | POA: Diagnosis not present

## 2018-05-03 DIAGNOSIS — M5417 Radiculopathy, lumbosacral region: Secondary | ICD-10-CM | POA: Diagnosis not present

## 2018-05-06 DIAGNOSIS — M5417 Radiculopathy, lumbosacral region: Secondary | ICD-10-CM | POA: Diagnosis not present

## 2018-05-06 DIAGNOSIS — M545 Low back pain: Secondary | ICD-10-CM | POA: Diagnosis not present

## 2018-05-06 DIAGNOSIS — M542 Cervicalgia: Secondary | ICD-10-CM | POA: Diagnosis not present

## 2018-05-06 DIAGNOSIS — M62830 Muscle spasm of back: Secondary | ICD-10-CM | POA: Diagnosis not present

## 2018-05-13 DIAGNOSIS — M62830 Muscle spasm of back: Secondary | ICD-10-CM | POA: Diagnosis not present

## 2018-05-13 DIAGNOSIS — M542 Cervicalgia: Secondary | ICD-10-CM | POA: Diagnosis not present

## 2018-05-13 DIAGNOSIS — M5417 Radiculopathy, lumbosacral region: Secondary | ICD-10-CM | POA: Diagnosis not present

## 2018-05-13 DIAGNOSIS — M545 Low back pain: Secondary | ICD-10-CM | POA: Diagnosis not present

## 2018-09-02 DIAGNOSIS — M545 Low back pain: Secondary | ICD-10-CM | POA: Diagnosis not present

## 2018-09-02 DIAGNOSIS — M5417 Radiculopathy, lumbosacral region: Secondary | ICD-10-CM | POA: Diagnosis not present

## 2018-09-02 DIAGNOSIS — M62830 Muscle spasm of back: Secondary | ICD-10-CM | POA: Diagnosis not present

## 2018-09-02 DIAGNOSIS — M542 Cervicalgia: Secondary | ICD-10-CM | POA: Diagnosis not present

## 2018-10-22 DIAGNOSIS — L648 Other androgenic alopecia: Secondary | ICD-10-CM | POA: Diagnosis not present

## 2018-10-22 DIAGNOSIS — D485 Neoplasm of uncertain behavior of skin: Secondary | ICD-10-CM | POA: Diagnosis not present

## 2018-10-22 DIAGNOSIS — L989 Disorder of the skin and subcutaneous tissue, unspecified: Secondary | ICD-10-CM | POA: Diagnosis not present

## 2018-10-22 DIAGNOSIS — L259 Unspecified contact dermatitis, unspecified cause: Secondary | ICD-10-CM | POA: Diagnosis not present

## 2018-10-28 DIAGNOSIS — L989 Disorder of the skin and subcutaneous tissue, unspecified: Secondary | ICD-10-CM | POA: Diagnosis not present

## 2018-11-23 ENCOUNTER — Ambulatory Visit (INDEPENDENT_AMBULATORY_CARE_PROVIDER_SITE_OTHER): Payer: BC Managed Care – PPO | Admitting: Family Medicine

## 2018-11-23 ENCOUNTER — Encounter: Payer: Self-pay | Admitting: Family Medicine

## 2018-11-23 ENCOUNTER — Telehealth: Payer: Self-pay

## 2018-11-23 ENCOUNTER — Telehealth: Payer: Self-pay | Admitting: Family Medicine

## 2018-11-23 DIAGNOSIS — J069 Acute upper respiratory infection, unspecified: Secondary | ICD-10-CM | POA: Diagnosis not present

## 2018-11-23 DIAGNOSIS — Z20822 Contact with and (suspected) exposure to covid-19: Secondary | ICD-10-CM

## 2018-11-23 MED ORDER — BENZONATATE 100 MG PO CAPS
100.0000 mg | ORAL_CAPSULE | Freq: Three times a day (TID) | ORAL | 0 refills | Status: DC | PRN
Start: 2018-11-23 — End: 2019-08-19

## 2018-11-23 NOTE — Telephone Encounter (Signed)
Patient scheduled for tomorrow. He asks for a note for work, I advised I will send this request to Dr. Nani Ravens.

## 2018-11-23 NOTE — Progress Notes (Signed)
Chief Complaint  Patient presents with  . Cough  . Fatigue    Michael George here for URI complaints. Due to COVID-19 pandemic, we are interacting via web portal for an electronic face-to-face visit. I verified patient's ID using 2 identifiers. Patient agreed to proceed with visit via this method. Patient is at home, I am at office. Patient and I are present for visit.   Duration: 2 days  Associated symptoms: myalgia and cough, fatigue Denies: sinus congestion, rhinorrhea, itchy watery eyes, ear pain, ear drainage, sore throat, wheezing, fevers and shortness of breath Treatment to date: none Sick contacts: No  ROS:  Const: Denies fevers HEENT: As noted in HPI Lungs: No SOB  Past Medical History:  Diagnosis Date  . Back pain 2017   L4-5 transforaminal epidural injection on 07/20/2015  . Compartment syndrome ([place]) 2016  . Depression   . Psoriasis 2014  . Radiculitis 2017   L side, good partial benefit after epidural steriod inj  . Stargardt's disease    Exam No conversational dyspnea Age appropriate judgment and insight Nml affect and mood  URI with cough and congestion - Plan: benzonatate (TESSALON) 100 MG capsule, will test for COVID-19 Continue to push fluids, practice good hand hygiene, cover mouth when coughing. F/u prn. If starting to experience fevers, shaking, or shortness of breath, seek immediate care. Pt voiced understanding and agreement to the plan.  [name], DO 11/23/18 3:40 PM

## 2018-11-23 NOTE — Telephone Encounter (Signed)
Patient called and advised of the request for covid testing, he verbalized understanding. Appointment scheduled for tomorrow, 11/24/18 at 1100 at [facility], advised of location and to wear a mask for everyone in the vehicle, he verbalized understanding. Order placed.

## 2018-11-24 ENCOUNTER — Other Ambulatory Visit: Payer: BC Managed Care – PPO

## 2018-11-24 ENCOUNTER — Encounter: Payer: Self-pay | Admitting: Family Medicine

## 2018-11-24 DIAGNOSIS — R6889 Other general symptoms and signs: Secondary | ICD-10-CM | POA: Diagnosis not present

## 2018-11-24 DIAGNOSIS — Z20822 Contact with and (suspected) exposure to covid-19: Secondary | ICD-10-CM

## 2018-11-24 NOTE — Telephone Encounter (Signed)
Patient's father called and asked about fast testing, he says the patient was tested today and they told him 3-5 days and he says his son is sick. He placed his wife on the phone.  I advised the testing sites do not offer fast testing and most fast testing is done in the hospitals. [facility] is running all the covid tests as well as other labs that come in to run, so saying up to 5 days will allow the extra time needed to accomodate the large amount of tests that they receive, she verbalized understanding.

## 2018-11-24 NOTE — Telephone Encounter (Signed)
Letter completed/patient aware and will pickup at the front desk.

## 2018-11-24 NOTE — Telephone Encounter (Signed)
Please excuse through Friday, stating he can return sooner if feeling better. Ty.

## 2018-11-26 NOTE — Telephone Encounter (Signed)
Pt need another return to work note due to his covid results has not came back yet. Pt's mom will pick up letter. Please call pt when letter is ready.

## 2018-11-26 NOTE — Telephone Encounter (Signed)
11/29/2018 if no fevers for 3 d and assuming he is feeling better. Ty.

## 2018-11-26 NOTE — Telephone Encounter (Signed)
What day to return

## 2018-11-28 LAB — NOVEL CORONAVIRUS, NAA: SARS-CoV-2, NAA: NOT DETECTED

## 2018-11-29 ENCOUNTER — Encounter: Payer: Self-pay | Admitting: Family Medicine

## 2018-11-29 NOTE — Telephone Encounter (Signed)
Patient informed and picked up letter.

## 2019-08-15 DIAGNOSIS — M545 Low back pain: Secondary | ICD-10-CM | POA: Diagnosis not present

## 2019-08-15 DIAGNOSIS — M542 Cervicalgia: Secondary | ICD-10-CM | POA: Diagnosis not present

## 2019-08-15 DIAGNOSIS — M62830 Muscle spasm of back: Secondary | ICD-10-CM | POA: Diagnosis not present

## 2019-08-15 DIAGNOSIS — M5417 Radiculopathy, lumbosacral region: Secondary | ICD-10-CM | POA: Diagnosis not present

## 2019-08-17 DIAGNOSIS — M545 Low back pain: Secondary | ICD-10-CM | POA: Diagnosis not present

## 2019-08-17 DIAGNOSIS — M5417 Radiculopathy, lumbosacral region: Secondary | ICD-10-CM | POA: Diagnosis not present

## 2019-08-17 DIAGNOSIS — M62830 Muscle spasm of back: Secondary | ICD-10-CM | POA: Diagnosis not present

## 2019-08-17 DIAGNOSIS — M542 Cervicalgia: Secondary | ICD-10-CM | POA: Diagnosis not present

## 2019-08-19 ENCOUNTER — Encounter: Payer: Self-pay | Admitting: Family Medicine

## 2019-08-19 ENCOUNTER — Ambulatory Visit (INDEPENDENT_AMBULATORY_CARE_PROVIDER_SITE_OTHER): Payer: BC Managed Care – PPO | Admitting: Family Medicine

## 2019-08-19 DIAGNOSIS — M542 Cervicalgia: Secondary | ICD-10-CM | POA: Diagnosis not present

## 2019-08-19 DIAGNOSIS — K219 Gastro-esophageal reflux disease without esophagitis: Secondary | ICD-10-CM | POA: Diagnosis not present

## 2019-08-19 DIAGNOSIS — M545 Low back pain: Secondary | ICD-10-CM | POA: Diagnosis not present

## 2019-08-19 DIAGNOSIS — M62830 Muscle spasm of back: Secondary | ICD-10-CM | POA: Diagnosis not present

## 2019-08-19 DIAGNOSIS — M5417 Radiculopathy, lumbosacral region: Secondary | ICD-10-CM | POA: Diagnosis not present

## 2019-08-19 MED ORDER — PANTOPRAZOLE SODIUM 40 MG PO TBEC: 40.0000 mg | DELAYED_RELEASE_TABLET | Freq: Every day | ORAL | 1 refills | Status: DC

## 2019-08-19 NOTE — Progress Notes (Signed)
Chief Complaint  Patient presents with  . Follow-up    throat tightness.  . Chest Pain    acid reflux    Michael George is a 34 y.o. male here for evaluation of chest pain.  Duration of issue: 1 week Quality: Sharp, dull/burning Palliation: Pepto Bismol Provocation: eating sometimes, laying down Severity: 5/10 Radiation: none Duration of chest pain: seems to last all day Associated symptoms: feels throat closing up Cardiac history: none Family heart history: none Smoker? No   Past Medical History:  Diagnosis Date  . Back pain 2017   L4-5 transforaminal epidural injection on 07/20/2015  . Compartment syndrome (HCC) 2016  . Depression   . Psoriasis 2014  . Radiculitis 2017   L side, good partial benefit after epidural steriod inj  . Stargardt's disease    Family History  Problem Relation Age of Onset  . Heart disease Neg Hx     Exam No conversational dyspnea Age appropriate judgment and insight Nml affect and mood  Gastroesophageal reflux disease, unspecified whether esophagitis present - Plan: pantoprazole (PROTONIX) 40 MG tablet  PPI BID for 2 weeks then daily for 2 months. [employer] HOB. Wt loss. Mind dietary triggers.  F/u prn. The patient voiced understanding and agreement to the plan.  Jilda Roche [place], DO 08/19/19 1:30 PM

## 2019-08-22 DIAGNOSIS — M5417 Radiculopathy, lumbosacral region: Secondary | ICD-10-CM | POA: Diagnosis not present

## 2019-08-22 DIAGNOSIS — M542 Cervicalgia: Secondary | ICD-10-CM | POA: Diagnosis not present

## 2019-08-22 DIAGNOSIS — M62830 Muscle spasm of back: Secondary | ICD-10-CM | POA: Diagnosis not present

## 2019-08-22 DIAGNOSIS — M545 Low back pain: Secondary | ICD-10-CM | POA: Diagnosis not present

## 2019-08-24 DIAGNOSIS — M62830 Muscle spasm of back: Secondary | ICD-10-CM | POA: Diagnosis not present

## 2019-08-24 DIAGNOSIS — M545 Low back pain: Secondary | ICD-10-CM | POA: Diagnosis not present

## 2019-08-24 DIAGNOSIS — M5417 Radiculopathy, lumbosacral region: Secondary | ICD-10-CM | POA: Diagnosis not present

## 2019-08-24 DIAGNOSIS — M542 Cervicalgia: Secondary | ICD-10-CM | POA: Diagnosis not present

## 2019-08-29 DIAGNOSIS — M545 Low back pain: Secondary | ICD-10-CM | POA: Diagnosis not present

## 2019-08-29 DIAGNOSIS — M5417 Radiculopathy, lumbosacral region: Secondary | ICD-10-CM | POA: Diagnosis not present

## 2019-08-29 DIAGNOSIS — M62830 Muscle spasm of back: Secondary | ICD-10-CM | POA: Diagnosis not present

## 2019-08-29 DIAGNOSIS — M542 Cervicalgia: Secondary | ICD-10-CM | POA: Diagnosis not present

## 2019-08-31 DIAGNOSIS — M62830 Muscle spasm of back: Secondary | ICD-10-CM | POA: Diagnosis not present

## 2019-08-31 DIAGNOSIS — M5417 Radiculopathy, lumbosacral region: Secondary | ICD-10-CM | POA: Diagnosis not present

## 2019-08-31 DIAGNOSIS — M542 Cervicalgia: Secondary | ICD-10-CM | POA: Diagnosis not present

## 2019-08-31 DIAGNOSIS — M545 Low back pain: Secondary | ICD-10-CM | POA: Diagnosis not present

## 2019-09-07 DIAGNOSIS — M545 Low back pain: Secondary | ICD-10-CM | POA: Diagnosis not present

## 2019-09-07 DIAGNOSIS — M5417 Radiculopathy, lumbosacral region: Secondary | ICD-10-CM | POA: Diagnosis not present

## 2019-09-07 DIAGNOSIS — M542 Cervicalgia: Secondary | ICD-10-CM | POA: Diagnosis not present

## 2019-09-07 DIAGNOSIS — M62830 Muscle spasm of back: Secondary | ICD-10-CM | POA: Diagnosis not present

## 2019-09-14 DIAGNOSIS — M545 Low back pain: Secondary | ICD-10-CM | POA: Diagnosis not present

## 2019-09-14 DIAGNOSIS — M5417 Radiculopathy, lumbosacral region: Secondary | ICD-10-CM | POA: Diagnosis not present

## 2019-09-14 DIAGNOSIS — M542 Cervicalgia: Secondary | ICD-10-CM | POA: Diagnosis not present

## 2019-09-14 DIAGNOSIS — M62830 Muscle spasm of back: Secondary | ICD-10-CM | POA: Diagnosis not present

## 2019-09-28 DIAGNOSIS — M62830 Muscle spasm of back: Secondary | ICD-10-CM | POA: Diagnosis not present

## 2019-09-28 DIAGNOSIS — M545 Low back pain: Secondary | ICD-10-CM | POA: Diagnosis not present

## 2019-09-28 DIAGNOSIS — M542 Cervicalgia: Secondary | ICD-10-CM | POA: Diagnosis not present

## 2019-09-28 DIAGNOSIS — M5417 Radiculopathy, lumbosacral region: Secondary | ICD-10-CM | POA: Diagnosis not present

## 2019-10-21 ENCOUNTER — Other Ambulatory Visit: Payer: Self-pay

## 2019-10-21 ENCOUNTER — Ambulatory Visit (INDEPENDENT_AMBULATORY_CARE_PROVIDER_SITE_OTHER): Payer: BC Managed Care – PPO | Admitting: Medical

## 2019-10-21 ENCOUNTER — Ambulatory Visit (HOSPITAL_BASED_OUTPATIENT_CLINIC_OR_DEPARTMENT_OTHER)
Admission: RE | Admit: 2019-10-21 | Discharge: 2019-10-21 | Disposition: A | Discharge status: Home / Self Care | Payer: BC Managed Care – PPO | Source: Ambulatory Visit | Attending: Medical | Admitting: Medical

## 2019-10-21 ENCOUNTER — Encounter: Payer: Self-pay | Admitting: Medical

## 2019-10-21 VITALS — BP 118/76 | HR 97 | Resp 16 | Ht 72.0 in | Wt 213.0 lb

## 2019-10-21 DIAGNOSIS — R0600 Dyspnea, unspecified: Secondary | ICD-10-CM

## 2019-10-21 DIAGNOSIS — R5383 Other fatigue: Secondary | ICD-10-CM | POA: Diagnosis not present

## 2019-10-21 DIAGNOSIS — R1013 Epigastric pain: Secondary | ICD-10-CM

## 2019-10-21 DIAGNOSIS — Z0001 Encounter for general adult medical examination with abnormal findings: Secondary | ICD-10-CM

## 2019-10-21 DIAGNOSIS — R0602 Shortness of breath: Secondary | ICD-10-CM | POA: Diagnosis not present

## 2019-10-21 DIAGNOSIS — R0789 Other chest pain: Secondary | ICD-10-CM

## 2019-10-21 DIAGNOSIS — R635 Abnormal weight gain: Secondary | ICD-10-CM

## 2019-10-21 DIAGNOSIS — Z0000 Encounter for general adult medical examination without abnormal findings: Secondary | ICD-10-CM

## 2019-10-21 LAB — LIPID PANEL
Cholesterol: 154 mg/dL (ref 0–200)
HDL: 38.9 mg/dL — ABNORMAL LOW (ref >39.00)
LDL Cholesterol: 75 mg/dL (ref 0–99)
NonHDL: 114.95
Total CHOL/HDL Ratio: 4
Triglycerides: 199 mg/dL — ABNORMAL HIGH (ref 0.0–149.0)
VLDL: 39.8 mg/dL (ref 0.0–40.0)

## 2019-10-21 LAB — CBC WITH DIFFERENTIAL/PLATELET
Basophils Absolute: 0 10*3/uL (ref 0.0–0.1)
Basophils Relative: 0.7 % (ref 0.0–3.0)
Eosinophils Absolute: 0.2 10*3/uL (ref 0.0–0.7)
Eosinophils Relative: 3.4 % (ref 0.0–5.0)
HCT: 46.9 % (ref 39.0–52.0)
Hemoglobin: 16.3 g/dL (ref 13.0–17.0)
Lymphocytes Relative: 23.5 % (ref 12.0–46.0)
Lymphs Abs: 1.5 10*3/uL (ref 0.7–4.0)
MCHC: 34.7 g/dL (ref 30.0–36.0)
MCV: 91.1 fl (ref 78.0–100.0)
Monocytes Absolute: 0.7 10*3/uL (ref 0.1–1.0)
Monocytes Relative: 10.5 % (ref 3.0–12.0)
Neutro Abs: 4.1 10*3/uL (ref 1.4–7.7)
Neutrophils Relative %: 61.9 % (ref 43.0–77.0)
Platelets: 186 10*3/uL (ref 150.0–400.0)
RBC: 5.15 Mil/uL (ref 4.22–5.81)
RDW: 12.9 % (ref 11.5–15.5)
WBC: 6.6 10*3/uL (ref 4.0–10.5)

## 2019-10-21 LAB — COMPREHENSIVE METABOLIC PANEL
ALT: 20 U/L (ref 0–53)
AST: 21 U/L (ref 0–37)
Albumin: 4.8 g/dL (ref 3.5–5.2)
Alkaline Phosphatase: 69 U/L (ref 39–117)
BUN: 15 mg/dL (ref 6–23)
CO2: 32 mEq/L (ref 19–32)
Calcium: 10.1 mg/dL (ref 8.4–10.5)
Chloride: 103 mEq/L (ref 96–112)
Creatinine, Ser: 1.38 mg/dL (ref 0.40–1.50)
GFR: 58.84 mL/min — ABNORMAL LOW (ref >60.00)
Glucose, Bld: 96 mg/dL (ref 70–99)
Potassium: 4.1 mEq/L (ref 3.5–5.1)
Sodium: 142 mEq/L (ref 135–145)
Total Bilirubin: 0.6 mg/dL (ref 0.2–1.2)
Total Protein: 7.1 g/dL (ref 6.0–8.3)

## 2019-10-21 LAB — LIPASE: Lipase: 102 U/L — ABNORMAL HIGH (ref 11.0–59.0)

## 2019-10-21 LAB — VITAMIN B12: Vitamin B-12: 230 pg/mL (ref 211–911)

## 2019-10-21 LAB — TSH: TSH: 2.27 u[IU]/mL (ref 0.35–4.50)

## 2019-10-21 MED ORDER — FAMOTIDINE 20 MG PO TABS
20.0000 mg | ORAL_TABLET | Freq: Every day | ORAL | 3 refills | Status: DC
Start: 2019-10-21 — End: 2020-02-20

## 2019-10-21 MED ORDER — RABEPRAZOLE SODIUM 20 MG PO TBEC
20.0000 mg | DELAYED_RELEASE_TABLET | Freq: Every day | ORAL | 3 refills | Status: DC
Start: 2019-10-21 — End: 2019-11-08

## 2019-10-21 NOTE — Patient Instructions (Addendum)
For you wellness exam today I have ordered cbc, cmp And  lipid panel.  tdap declined. Can get later just let know or if you need after metal puncuture or cut.  Recommend exercise and healthy diet.   Follow up 3 weeks or as needed  For fatigue get b1, b12 and tsh.  For heart burn epigastric pain added lipase and h pylori. I think you can start aciphex  and I can add famotadine.Marland Kitchen stop protonix as you thought you had side effects.  Your atypical chest pain appears to be associated with gerd. But we did ekg today for caution sake. If you have chest pain worse or changing then ED evaluation. ekg normal sinus rhythm.  Your dysnea and brief wheeze might be reflux related as you note occurred when lying down and reflux can occur easily when supine.  You had normal neuro exam today. Dizziness other day seemed to correlate with nausea when you had gerd. If dizziness return please notify us.  You have been gaining wt gradually since 2013. Recommend wt watchers. Your bmi approaching obese range.    Preventive Care 48-63 Years Old, Male Preventive care refers to lifestyle choices and visits with your health care provider that can promote health and wellness. This includes:  A yearly physical exam. This is also called an annual well check.  Regular dental and eye exams.  Immunizations.  Screening for certain conditions.  Healthy lifestyle choices, such as eating a healthy diet, getting regular exercise, not using drugs or products that contain nicotine and tobacco, and limiting alcohol use. What can I expect for my preventive care visit? Physical exam Your health care provider will check:  Height and weight. These may be used to calculate body mass index (BMI), which is a measurement that tells if you are at a healthy weight.  Heart rate and blood pressure.  Your skin for abnormal spots. Counseling Your health care provider may ask you questions about:  Alcohol, tobacco, and drug  use.  Emotional well-being.  Home and relationship well-being.  Sexual activity.  Eating habits.  Work and work Statistician. What immunizations do I need?  Influenza (flu) vaccine  This is recommended every year. Tetanus, diphtheria, and pertussis (Tdap) vaccine  You may need a Td booster every 10 years. Varicella (chickenpox) vaccine  You may need this vaccine if you have not already been vaccinated. Human papillomavirus (HPV) vaccine  If recommended by your health care provider, you may need three doses over 6 months. Measles, mumps, and rubella (MMR) vaccine  You may need at least one dose of MMR. You may also need a second dose. Meningococcal conjugate (MenACWY) vaccine  One dose is recommended if you are 22-9 years old and a Market researcher living in a residence hall, or if you have one of several medical conditions. You may also need additional booster doses. Pneumococcal conjugate (PCV13) vaccine  You may need this if you have certain conditions and were not previously vaccinated. Pneumococcal polysaccharide (PPSV23) vaccine  You may need one or two doses if you smoke cigarettes or if you have certain conditions. Hepatitis A vaccine  You may need this if you have certain conditions or if you travel or work in places where you may be exposed to hepatitis A. Hepatitis B vaccine  You may need this if you have certain conditions or if you travel or work in places where you may be exposed to hepatitis B. Haemophilus influenzae type b (Hib) vaccine  You  may need this if you have certain risk factors. You may receive vaccines as individual doses or as more than one vaccine together in one shot (combination vaccines). Talk with your health care provider about the risks and benefits of combination vaccines. What tests do I need? Blood tests  Lipid and cholesterol levels. These may be checked every 5 years starting at age 77.  Hepatitis C  test.  Hepatitis B test. Screening   Diabetes screening. This is done by checking your blood sugar (glucose) after you have not eaten for a while (fasting).  Sexually transmitted disease (STD) testing. Talk with your health care provider about your test results, treatment options, and if necessary, the need for more tests. Follow these instructions at home: Eating and drinking   Eat a diet that includes fresh fruits and vegetables, whole grains, lean protein, and low-fat dairy products.  Take vitamin and mineral supplements as recommended by your health care provider.  Do not drink alcohol if your health care provider tells you not to drink.  If you drink alcohol: ? Limit how much you have to 0-2 drinks a day. ? Be aware of how much alcohol is in your drink. In the [place] one drink equals one 12 oz bottle of beer (355 mL), one 5 oz glass of wine (148 mL), or one 1 oz glass of hard liquor (44 mL). Lifestyle  Take daily care of your teeth and gums.  Stay active. Exercise for at least 30 minutes on 5 or more days each week.  Do not use any products that contain nicotine or tobacco, such as cigarettes, e-cigarettes, and chewing tobacco. If you need help quitting, ask your health care provider.  If you are sexually active, practice safe sex. Use a condom or other form of protection to prevent STIs (sexually transmitted infections). What's next?  Go to your health care provider once a year for a well check visit.  Ask your health care provider how often you should have your eyes and teeth checked.  Stay up to date on all vaccines. This information is not intended to replace advice given to you by your health care provider. Make sure you discuss any questions you have with your health care provider. Document Revised: 05/13/2018 Document Reviewed: 05/13/2018 Elsevier Patient Education  2020 [employer].

## 2019-10-21 NOTE — Progress Notes (Signed)
Subjective:    Patient ID: Michael George, male    DOB: 06-19-1985, 34 y.o.   MRN: [id number]  HPI Pt here for cpe.  Pt has not eaten breakfast.   Pt works as Scientist, physiological part time, exercising 2-3 days a week but prior exercised more. He drinks one coke a day and a lot unsweet tea. Eats a lot of chocalate. Eating fried foods moderate. No alcohol and non smoker.  Declined hiv and tdap.   He does mention heart burn over past month. He states heart burn was getting better with medication. But then he state still feels indigestion. He does burp some during the exam.   He was having some symptoms that he attributed to heart burn medications. So he stopped. He states the other day he got dizziness with some blurred vision. Last few minutes. Then felt little off for 5-10 minutes. No ha with dizziness. No gross motor or sensory function deficits at that time. No ha. He states this has happened few times. He states felt like this when he had heart burn. He felt little nausea as well. Pt had been on pantoprazole at that time. Stopped protonix on Monday.  He is also feeling some mild shortness of breath. No hx of asthma. States a couple of weeks ago at night he was wheezing.   Pt has history of atypical chest pain on chart review. He saw pcp by video. States with actitivity/exercise will get atypical chest pain. But also states reflux get worse with exercise.       Review of Systems  Constitutional: Positive for fatigue. Negative for chills and fever.  HENT: Negative for congestion, sinus pressure and sinus pain.   Respiratory: Positive for wheezing. Negative for cough and chest tightness.        See hpi.  Cardiovascular: Negative for chest pain and palpitations.       None now but see hpi.  Gastrointestinal: Positive for abdominal pain.  Genitourinary: Negative for dysuria and flank pain.  Musculoskeletal: Negative for back pain and myalgias.  Skin: Negative for rash.  Neurological:  Negative for dizziness, seizures, syncope, weakness and headaches.  Hematological: Negative for adenopathy. Does not bruise/bleed easily.  Psychiatric/Behavioral: Negative for behavioral problems, decreased concentration, hallucinations and suicidal ideas. The patient is not nervous/anxious.        Objective:   Physical Exam  General Mental Status- Alert. General Appearance- Not in acute distress.   Skin General: Color- Normal Color. Moisture- Normal Moisture.  Neck Carotid Arteries- Normal color. Moisture- Normal Moisture. No carotid bruits. No JVD.  Chest and Lung Exam Auscultation: Breath Sounds:-Normal.  Cardiovascular Auscultation:Rythm- Regular. Murmurs & Other Heart Sounds:Auscultation of the heart reveals- No Murmurs.  Abdomen Inspection:-Inspeection Normal. Palpation/Percussion:Note:No mass. Palpation and Percussion of the abdomen reveal- mild epigastric  Tender, Non Distended + BS, no rebound or guarding.    Neurologic Cranial Nerve exam:- CN III-XII intact(No nystagmus), symmetric smile. Strength:- 5/5 equal and symmetric strength both upper and lower extremities.      Assessment & Plan:  For you wellness exam today I have ordered cbc, cmp And  lipid panel.  tdap declined. Can get later just let know or if you need after metal puncuture or cut.  Recommend exercise and healthy diet.   Follow up 3 weeks or as needed  For fatigue get b1, b12 and tsh.  For heart burn epigastric pain added lipase and h pylori. I think you can start aciphex  and I can add  famotadine.Marland Kitchen stop protonix as you thought you had side effects.  Your atypical chest pain appears to be associated with gerd. But we did ekg today for caution sake. If you have chest pain worse or changing then ED evaluation. ekg normal sinus rhythm.  Your dysnea and brief wheeze might be reflux related as you note occurred when lying down and reflux can occur easily when supine.  You had normal neuro  exam today. Dizziness other day seemed to correlate with nausea when you had gerd. If dizziness return please notify us.  You have been gaining wt gradually since 2013. Recommend wt watchers. Your bmi approaching obese range.  Mackie Pai, PA-C    [phone] charge.spent additional 22 minutes discussing and addressing gerd/epigastric pain, atypical chest pain, rare dyspnea, dizziness and wt gain.

## 2019-10-22 ENCOUNTER — Telehealth: Payer: Self-pay | Admitting: Medical

## 2019-10-22 DIAGNOSIS — R109 Unspecified abdominal pain: Secondary | ICD-10-CM

## 2019-10-22 DIAGNOSIS — R748 Abnormal levels of other serum enzymes: Secondary | ICD-10-CM

## 2019-10-22 NOTE — Telephone Encounter (Signed)
Future lipase and cmp placed. Please get scheduled for next Thursday or friday

## 2019-10-24 LAB — H. PYLORI BREATH TEST: H. pylori Breath Test: NOT DETECTED

## 2019-10-24 NOTE — Telephone Encounter (Signed)
Called pt and lvm to return call to schedule labs

## 2019-10-25 LAB — VITAMIN B1: Vitamin B1 (Thiamine): 7 nmol/L — ABNORMAL LOW (ref 8–30)

## 2019-10-25 NOTE — Telephone Encounter (Signed)
Called pt and lvm to return call.  

## 2019-10-26 DIAGNOSIS — M545 Low back pain: Secondary | ICD-10-CM | POA: Diagnosis not present

## 2019-10-26 DIAGNOSIS — M542 Cervicalgia: Secondary | ICD-10-CM | POA: Diagnosis not present

## 2019-10-26 DIAGNOSIS — M62830 Muscle spasm of back: Secondary | ICD-10-CM | POA: Diagnosis not present

## 2019-10-26 DIAGNOSIS — M5417 Radiculopathy, lumbosacral region: Secondary | ICD-10-CM | POA: Diagnosis not present

## 2019-10-26 NOTE — Telephone Encounter (Signed)
Scheduled pt appt for next week due.

## 2019-11-03 ENCOUNTER — Other Ambulatory Visit: Payer: Self-pay

## 2019-11-03 ENCOUNTER — Other Ambulatory Visit (INDEPENDENT_AMBULATORY_CARE_PROVIDER_SITE_OTHER): Payer: BC Managed Care – PPO

## 2019-11-03 ENCOUNTER — Telehealth: Payer: Self-pay | Admitting: Medical

## 2019-11-03 DIAGNOSIS — R109 Unspecified abdominal pain: Secondary | ICD-10-CM | POA: Diagnosis not present

## 2019-11-03 DIAGNOSIS — R748 Abnormal levels of other serum enzymes: Secondary | ICD-10-CM | POA: Diagnosis not present

## 2019-11-03 LAB — LIPASE: Lipase: 82 U/L — ABNORMAL HIGH (ref 11.0–59.0)

## 2019-11-03 LAB — COMPREHENSIVE METABOLIC PANEL
ALT: 19 U/L (ref 0–53)
AST: 25 U/L (ref 0–37)
Albumin: 5 g/dL (ref 3.5–5.2)
Alkaline Phosphatase: 60 U/L (ref 39–117)
BUN: 15 mg/dL (ref 6–23)
CO2: 28 mEq/L (ref 19–32)
Calcium: 9.6 mg/dL (ref 8.4–10.5)
Chloride: 101 mEq/L (ref 96–112)
Creatinine, Ser: 1.33 mg/dL (ref 0.40–1.50)
GFR: 61.38 mL/min (ref >60.00)
Glucose, Bld: 95 mg/dL (ref 70–99)
Potassium: 3.9 mEq/L (ref 3.5–5.1)
Sodium: 137 mEq/L (ref 135–145)
Total Bilirubin: 0.7 mg/dL (ref 0.2–1.2)
Total Protein: 7.5 g/dL (ref 6.0–8.3)

## 2019-11-03 NOTE — Telephone Encounter (Signed)
Referral to gi placed. °

## 2019-11-08 ENCOUNTER — Ambulatory Visit: Payer: BC Managed Care – PPO | Admitting: Gastroenterology

## 2019-11-08 ENCOUNTER — Other Ambulatory Visit (INDEPENDENT_AMBULATORY_CARE_PROVIDER_SITE_OTHER): Payer: BC Managed Care – PPO

## 2019-11-08 ENCOUNTER — Encounter: Payer: Self-pay | Admitting: Gastroenterology

## 2019-11-08 VITALS — BP 126/68 | HR 78 | Ht 72.0 in | Wt 207.0 lb

## 2019-11-08 DIAGNOSIS — R948 Abnormal results of function studies of other organs and systems: Secondary | ICD-10-CM | POA: Diagnosis not present

## 2019-11-08 DIAGNOSIS — R109 Unspecified abdominal pain: Secondary | ICD-10-CM

## 2019-11-08 LAB — HEPATIC FUNCTION PANEL
ALT: 19 U/L (ref 0–53)
AST: 25 U/L (ref 0–37)
Albumin: 4.9 g/dL (ref 3.5–5.2)
Alkaline Phosphatase: 58 U/L (ref 39–117)
Bilirubin, Direct: 0.2 mg/dL (ref 0.0–0.3)
Total Bilirubin: 0.8 mg/dL (ref 0.2–1.2)
Total Protein: 7.2 g/dL (ref 6.0–8.3)

## 2019-11-08 LAB — LIPASE: Lipase: 93 U/L — ABNORMAL HIGH (ref 11.0–59.0)

## 2019-11-08 LAB — AMYLASE: Amylase: 44 U/L (ref 27–131)

## 2019-11-08 NOTE — Progress Notes (Signed)
Referring Provider: Elise Benne Primary Care Physician:  Shelda Pal, DO  Reason for Consultation: Abdominal pain   IMPRESSION:  Reflux/Abdominal pain    - normal liver enzymes, calcium    - H pylori breath test negative Elevated lipase    - 102 10/21/19    - 82 11/03/19 Mildly elevated triglycerides Recent dermatitis  Suspected acute pancreatitis +/- reflux. Etiology is unclear. No prior episodes of pancreatitis. No prior abdominal imaging. Symptoms have improved but seems to be lingering.  Repeat labs today. Cross-sectional pancreas imaging.  Continue empiric PPI therapy.   PLAN: Continue pantoprazole 40 mg QAM Avoid fatty and greasy foods Lipase, amylase, liver enzymes CT abd/pelvis with contrast Follow-up with me or APP one week after the CT scan  Please see the "Patient Instructions" section for addition details about the plan.  HPI: Michael George is a 34 y.o. male referred by PA [place]o for further evaluation of abdominal pain. The history is obtained through the patient and review of his electronic health record.  He has depression and chronic headaches as a child.  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   Developed heart burn a couple of months ago with associated chest pain, regurgitation, nausea without vomiting, and shortness of breath.  May have had some epigastric pain at the time. Increased flatus. Pantoprazole 40 mg daily provided some relief. But he continued to feel terrible with eating - indigestion, dizzy and felt like he could pass out. Symptoms last for 10-15 minutes after eating.  Noticed some relief with sitting forwad.   No change in bowel habits, although has longstanding intermittent loose stools.  No known history of gallstones.  No alcohol. No cigarettes/smoking/vaping.  No hyperlipidemia or hypercalcemia.  No family history of pancreatitis or pancreatic cancer.  No recent infections. No history of vasculitis.  No  scorpion bite. No known trauma/injury to the abdomen.  Has used finasteride until a week ago. Trying shea butter for dermatitis that was previously treated with steroids. Intermittently using creatine.  No other prescription, herbal, or complementary medications.   Labs 10/21/2019 show a normal comprehensive metabolic panel including normal liver enzymes with an ALT of 20, alk phos 69, total bilirubin 0.6.  Lipase was elevated at 102.  CBC was normal including hemoglobin 16.3, platelets 186. TSH 2.27.  H pylori breath test negative.  Triglycerides 199.  Labs 11/03/19: Lipase 82 CXR 10/21/19: no active cardiopulmonary disease No recent abdominal imaging.  No family history of sarcoid.  Maternal grandmother with uterine cancer in her 72s. No known family history of colon cancer or polyps. No other family history of uterine/endometrial cancer, pancreatic cancer or gastric/stomach cancer.   Past Medical History:  Diagnosis Date  . Back pain 2017   L4-5 transforaminal epidural injection on 07/20/2015  . 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 Depression   . Psoriasis 2014  . Radiculitis 2017   L side, good partial benefit after epidural steriod inj  . Stargardt's disease     Past Surgical History:  Procedure Laterality Date  . BACK SURGERY      Current Outpatient Medications  Medication Sig Dispense Refill  . famotidine (PEPCID) 20 MG tablet Take 1 tablet (20 mg total) by mouth daily. 30 tablet 3  . finasteride (PROPECIA) 1 MG tablet Take 1 mg by mouth daily.    . RABEprazole (ACIPHEX) 20 MG tablet Take 1 tablet (20 mg total) by mouth daily. 30 tablet 3   No current facility-administered  medications for this visit.    Allergies as of 11/08/2019  . (No Known Allergies)    Family History  Problem Relation Age of Onset  . Heart disease Neg Hx     Social History   Socioeconomic History  . Marital status: Single    Spouse name: Not on file  . Number of children: Not on file  . Years  of education: Not on file  . Highest education level: Not on file  Occupational History  . Not on file  Tobacco Use  . Smoking status: Never Smoker  . Smokeless tobacco: Never Used  Substance and Sexual Activity  . Alcohol use: No  . Drug use: No  . Sexual activity: Not on file  Other Topics Concern  . Not on file  Social History Narrative  . Not on file   Social Determinants of Health   Financial Resource Strain:   . Difficulty of Paying Living Expenses:   Food Insecurity:   . Worried About Charity fundraiser in the Last Year:   . Arboriculturist in the Last Year:   Transportation Needs:   . Film/video editor (Medical):   Marland Kitchen Lack of Transportation (Non-Medical):   Physical Activity:   . Days of Exercise per Week:   . Minutes of Exercise per Session:   Stress:   . Feeling of Stress :   Social Connections:   . Frequency of Communication with Friends and Family:   . Frequency of Social Gatherings with Friends and Family:   . Attends Religious Services:   . Active Member of Clubs or Organizations:   . Attends Archivist Meetings:   Marland Kitchen Marital Status:   Intimate Partner Violence:   . Fear of Current or Ex-Partner:   . Emotionally Abused:   Marland Kitchen Physically Abused:   . Sexually Abused:     Review of Systems: 12 system ROS is negative except as noted above with the addition of back pain, shortness of breath, skin rash with dermatitis.   Physical Exam: General:   Alert,  well-nourished, pleasant and cooperative in NAD Head:  Normocephalic and atraumatic. Eyes:  Sclera clear, no icterus.   Conjunctiva pink. Ears:  Normal auditory acuity. Nose:  No deformity, discharge,  or lesions. Mouth:  No deformity or lesions.   Neck:  Supple; no masses or thyromegaly. Lungs:  Clear throughout to auscultation.   No wheezes. Heart:  Regular rate and rhythm; no murmurs. Abdomen:  Soft, nontender - I am unable to reproduce his abdominal pain, nondistended, normal bowel  sounds, no rebound or guarding. No hepatosplenomegaly.   Rectal:  Deferred  Msk:  Symmetrical. No boney deformities LAD: No inguinal or umbilical LAD Extremities:  No clubbing or edema. Neurologic:  Alert and  oriented x4;  grossly nonfocal Skin:  Intact without significant lesions or rashes. Psych:  Alert and cooperative. Normal mood and affect.   Eliyanna Ault L. Tarri Glenn, MD, MPH 11/08/2019, 1:07 PM

## 2019-11-08 NOTE — Patient Instructions (Addendum)
If you are age 35 or older, your body mass index should be between 23-30. Your Body mass index is 28.07 kg/m. If this is out of the aforementioned range listed, please consider follow up with your Primary Care Provider.  If you are age 78 or younger, your body mass index should be between 19-25. Your Body mass index is 28.07 kg/m. If this is out of the aformentioned range listed, please consider follow up with your Primary Care Provider.   You have been scheduled for a CT scan of the abdomen and pelvis at [facility] ([address]---this is in the same building as Press photographer).   You are scheduled on 11/11/2019 at 3:00pm. You should arrive 15 minutes prior to your appointment time for registration. Please follow the written instructions below on the day of your exam:  WARNING: IF YOU ARE ALLERGIC TO IODINE/X-RAY DYE, PLEASE NOTIFY RADIOLOGY IMMEDIATELY AT [phone]! YOU WILL BE GIVEN A 13 HOUR PREMEDICATION PREP.  1) Do not eat  anything after 11:00 am (4 hours prior to your test) you may have water only per [place] CT 2) You have been given 2 bottles of oral contrast to drink. The solution may taste better if refrigerated, but do NOT add ice or any other liquid to this solution. Shake well before drinking.    Drink 1 bottle of contrast @ 1:00pm (2 hours prior to your exam)  Drink 1 bottle of contrast @ 2:00 pm(1 hour prior to your exam)  You may take any medications as prescribed with a small amount of water, if necessary. If you take any of the following medications: METFORMIN, GLUCOPHAGE, GLUCOVANCE, AVANDAMET, RIOMET, FORTAMET, [place] MET, JANUMET, GLUMETZA or METAGLIP, you MAY be asked to HOLD this medication 48 hours AFTER the exam.  The purpose of you drinking the oral contrast is to aid in the visualization of your intestinal tract. The contrast solution may cause some diarrhea. Depending on your individual set of symptoms, you may also receive an intravenous  injection of x-ray contrast/dye. Plan on being at [facility] for 30 minutes or longer, depending on the type of exam you are having performed.  This test typically takes 30-45 minutes to complete.  If you have any questions regarding your exam or if you need to reschedule, you may call the CT department at [phone] between the hours of 8:00 am and 5:00 pm, Monday-Friday.  ________________________________________________________________________  Your provider has requested that you go to the basement level for lab work before leaving today. Press "B" on the elevator. The lab is located at the first door on the left as you exit the elevator.   Due to recent changes in healthcare laws, you may see the results of your imaging and laboratory studies on MyChart before your provider has had a chance to review them.  We understand that in some cases there may be results that are confusing or concerning to you. Not all laboratory results come back in the same time frame and the provider may be waiting for multiple results in order to interpret others.  Please give [place] 48 hours in order for your provider to thoroughly review all the results before contacting the office for clarification of your results.   Thank you for trusting me with your gastrointestinal care!    Thornton Park, MD, MPH

## 2019-11-09 ENCOUNTER — Telehealth: Payer: Self-pay | Admitting: General Surgery

## 2019-11-09 DIAGNOSIS — R109 Unspecified abdominal pain: Secondary | ICD-10-CM

## 2019-11-09 NOTE — Telephone Encounter (Signed)
Placed amb referral for EGD w/Dr Orvan Falconer

## 2019-11-11 ENCOUNTER — Other Ambulatory Visit: Payer: Self-pay

## 2019-11-11 ENCOUNTER — Ambulatory Visit (INDEPENDENT_AMBULATORY_CARE_PROVIDER_SITE_OTHER)
Admission: RE | Admit: 2019-11-11 | Discharge: 2019-11-11 | Disposition: A | Discharge status: Home / Self Care | Payer: BC Managed Care – PPO | Source: Ambulatory Visit | Attending: Gastroenterology | Admitting: Gastroenterology

## 2019-11-11 DIAGNOSIS — R948 Abnormal results of function studies of other organs and systems: Secondary | ICD-10-CM

## 2019-11-11 DIAGNOSIS — R109 Unspecified abdominal pain: Secondary | ICD-10-CM | POA: Diagnosis not present

## 2019-11-11 DIAGNOSIS — R1110 Vomiting, unspecified: Secondary | ICD-10-CM | POA: Diagnosis not present

## 2019-11-11 MED ORDER — IOHEXOL 300 MG/ML  SOLN
100.0000 mL | Freq: Once | INTRAMUSCULAR | Status: AC | PRN
  Administered 2019-11-11: 100 mL via INTRAVENOUS

## 2019-11-17 ENCOUNTER — Other Ambulatory Visit: Payer: Self-pay

## 2019-11-17 ENCOUNTER — Ambulatory Visit: Payer: BC Managed Care – PPO

## 2019-11-17 ENCOUNTER — Ambulatory Visit (AMBULATORY_SURGERY_CENTER): Payer: Self-pay | Admitting: *Deleted

## 2019-11-17 ENCOUNTER — Encounter: Payer: Self-pay | Admitting: Gastroenterology

## 2019-11-17 ENCOUNTER — Other Ambulatory Visit: Payer: Self-pay | Admitting: Gastroenterology

## 2019-11-17 VITALS — Ht 72.0 in | Wt 210.0 lb

## 2019-11-17 DIAGNOSIS — R109 Unspecified abdominal pain: Secondary | ICD-10-CM

## 2019-11-17 DIAGNOSIS — R948 Abnormal results of function studies of other organs and systems: Secondary | ICD-10-CM

## 2019-11-17 DIAGNOSIS — Z1159 Encounter for screening for other viral diseases: Secondary | ICD-10-CM

## 2019-11-17 NOTE — Progress Notes (Signed)

## 2019-11-18 LAB — SARS CORONAVIRUS 2 (TAT 6-24 HRS): SARS Coronavirus 2: NEGATIVE

## 2019-11-21 ENCOUNTER — Other Ambulatory Visit: Payer: Self-pay

## 2019-11-21 ENCOUNTER — Ambulatory Visit (AMBULATORY_SURGERY_CENTER): Payer: BC Managed Care – PPO | Admitting: Gastroenterology

## 2019-11-21 ENCOUNTER — Encounter: Payer: Self-pay | Admitting: Gastroenterology

## 2019-11-21 VITALS — BP 118/75 | HR 65 | Temp 97.1°F | Resp 11 | Ht 72.0 in | Wt 205.0 lb

## 2019-11-21 DIAGNOSIS — K319 Disease of stomach and duodenum, unspecified: Secondary | ICD-10-CM

## 2019-11-21 DIAGNOSIS — K2970 Gastritis, unspecified, without bleeding: Secondary | ICD-10-CM

## 2019-11-21 DIAGNOSIS — K2270 Barrett's esophagus without dysplasia: Secondary | ICD-10-CM | POA: Diagnosis not present

## 2019-11-21 DIAGNOSIS — K3189 Other diseases of stomach and duodenum: Secondary | ICD-10-CM | POA: Diagnosis not present

## 2019-11-21 DIAGNOSIS — R1012 Left upper quadrant pain: Secondary | ICD-10-CM | POA: Diagnosis not present

## 2019-11-21 MED ORDER — SODIUM CHLORIDE 0.9 % IV SOLN: 500.0000 mL | Freq: Once | INTRAVENOUS | Status: DC

## 2019-11-21 NOTE — Progress Notes (Signed)
pt tolerated well. VSS. awake and to recovery. Report given to RN.  

## 2019-11-21 NOTE — Patient Instructions (Signed)
Handout provided on gastritis.   No aspirin, ibuprofen, naproxen, or other non-steriodal anti-inflammatory drugs.  YOU HAD AN ENDOSCOPIC PROCEDURE TODAY AT THE [facility]:   Refer to the procedure report that was given to you for any specific questions about what was found during the examination.  If the procedure report does not answer your questions, please call your gastroenterologist to clarify.  If you requested that your care partner not be given the details of your procedure findings, then the procedure report has been included in a sealed envelope for you to review at your convenience later.  YOU SHOULD EXPECT: Some feelings of bloating in the abdomen. Passage of more gas than usual.  Walking can help get rid of the air that was put into your GI tract during the procedure and reduce the bloating. If you had a lower endoscopy (such as a colonoscopy or flexible sigmoidoscopy) you may notice spotting of blood in your stool or on the toilet paper. If you underwent a bowel prep for your procedure, you may not have a normal bowel movement for a few days.  Please Note:  You might notice some irritation and congestion in your nose or some drainage.  This is from the oxygen used during your procedure.  There is no need for concern and it should clear up in a day or so.  SYMPTOMS TO REPORT IMMEDIATELY:   Following upper endoscopy (EGD)  Vomiting of blood or coffee ground material  New chest pain or pain under the shoulder blades  Painful or persistently difficult swallowing  New shortness of breath  Fever of 100F or higher  Black, tarry-looking stools  For urgent or emergent issues, a gastroenterologist can be reached at any hour by calling [phone]. Do not use MyChart messaging for urgent concerns.    DIET:  We do recommend a small meal at first, but then you may proceed to your regular diet.  Drink plenty of fluids but you should avoid alcoholic beverages for 24  hours.  ACTIVITY:  You should plan to take it easy for the rest of today and you should NOT DRIVE or use heavy machinery until tomorrow (because of the sedation medicines used during the test).    FOLLOW UP: Our staff will call the number listed on your records 48-72 hours following your procedure to check on you and address any questions or concerns that you may have regarding the information given to you following your procedure. If we do not reach you, we will leave a message.  We will attempt to reach you two times.  During this call, we will ask if you have developed any symptoms of COVID 19. If you develop any symptoms (ie: fever, flu-like symptoms, shortness of breath, cough etc.) before then, please call [phone].  If you test positive for Covid 19 in the 2 weeks post procedure, please call and report this information to [place].    If any biopsies were taken you will be contacted by phone or by letter within the next 1-3 weeks.  Please call us at [phone] if you have not heard about the biopsies in 3 weeks.    SIGNATURES/CONFIDENTIALITY: You and/or your care partner have signed paperwork which will be entered into your electronic medical record.  These signatures attest to the fact that that the information above on your After Visit Summary has been reviewed and is understood.  Full responsibility of the confidentiality of this discharge information lies with you and/or  your care-partner.

## 2019-11-21 NOTE — Progress Notes (Signed)
Called to room to assist during endoscopic procedure.  Patient ID and intended procedure confirmed with present staff. Received instructions for my participation in the procedure from the performing physician.  

## 2019-11-21 NOTE — Op Note (Signed)
[facility] Patient Name: Michael George Procedure Date: 11/21/2019 8:21 AM MRN: [id number] Endoscopist: Tressia Danas MD, MD Age: 34 Referring MD:  Date of Birth: 09-08-1985 Gender: Male Account #: [id number] Procedure:                Upper GI endoscopy Indications:              Abdominal pain, Suspected gastro-esophageal reflux                            disease Medicines:                Monitored Anesthesia Care Procedure:                Pre-Anesthesia Assessment:                           - Prior to the procedure, a History and Physical                            was performed, and patient medications and                            allergies were reviewed. The patient's tolerance of                            previous anesthesia was also reviewed. The risks                            and benefits of the procedure and the sedation                            options and risks were discussed with the patient.                            All questions were answered, and informed consent                            was obtained. Prior Anticoagulants: The patient has                            taken no previous anticoagulant or antiplatelet                            agents. ASA Grade Assessment: I - A normal, healthy                            patient. After reviewing the risks and benefits,                            the patient was deemed in satisfactory condition to                            undergo the procedure.  After obtaining informed consent, the endoscope was                            passed under direct vision. Throughout the                            procedure, the patient's blood pressure, pulse, and                            oxygen saturations were monitored continuously. The                            Endoscope was introduced through the mouth, and                            advanced to the third part of duodenum. The upper                             GI endoscopy was accomplished without difficulty.                            The patient tolerated the procedure well. Scope In: Scope Out: Findings:                 The examined esophagus was normal. Biopsies were                            taken from the mid/proximal and distal esophagus                            with a cold forceps for histology. Estimated blood                            loss was minimal.                           Diffuse mildly erythematous mucosa without bleeding                            was found in the gastric body. Biopsies were taken                            from the antrum, body, and fundus with a cold                            forceps for histology. Estimated blood loss was                            minimal.                           The examined duodenum was normal. Biopsies were                            taken with a  cold forceps for histology. Estimated                            blood loss was minimal. Complications:            No immediate complications. Estimated Blood Loss:     Estimated blood loss: none. Impression:               - Normal esophagus. Biopsied.                           - Erythematous mucosa in the gastric body. Biopsied.                           - Normal examined duodenum. Biopsied. Recommendation:           - Patient has a contact number available for                            emergencies. The signs and symptoms of potential                            delayed complications were discussed with the                            patient. Return to normal activities tomorrow.                            Written discharge instructions were provided to the                            patient.                           - Resume previous diet.                           - Continue present medications.                           - No aspirin, ibuprofen, naproxen, or other                            non-steroidal  anti-inflammatory drugs.                           - Await pathology results to determine next steps. Tressia Danas MD, MD 11/21/2019 8:53:14 AM This report has been signed electronically.

## 2019-11-22 DIAGNOSIS — L648 Other androgenic alopecia: Secondary | ICD-10-CM | POA: Diagnosis not present

## 2019-11-22 DIAGNOSIS — B354 Tinea corporis: Secondary | ICD-10-CM | POA: Diagnosis not present

## 2019-11-23 ENCOUNTER — Other Ambulatory Visit: Payer: Self-pay

## 2019-11-23 ENCOUNTER — Telehealth: Payer: Self-pay | Admitting: *Deleted

## 2019-11-23 DIAGNOSIS — M5417 Radiculopathy, lumbosacral region: Secondary | ICD-10-CM | POA: Diagnosis not present

## 2019-11-23 DIAGNOSIS — M545 Low back pain: Secondary | ICD-10-CM | POA: Diagnosis not present

## 2019-11-23 DIAGNOSIS — M62830 Muscle spasm of back: Secondary | ICD-10-CM | POA: Diagnosis not present

## 2019-11-23 DIAGNOSIS — M542 Cervicalgia: Secondary | ICD-10-CM | POA: Diagnosis not present

## 2019-11-23 MED ORDER — PANTOPRAZOLE SODIUM 40 MG PO TBEC
40.0000 mg | DELAYED_RELEASE_TABLET | Freq: Two times a day (BID) | ORAL | 3 refills | Status: DC
Start: 2019-11-23 — End: 2020-02-14

## 2019-11-23 NOTE — Telephone Encounter (Signed)
  Follow up Call-  Call back number 11/21/2019  Post procedure Call Back phone  # [phone]  Permission to leave phone message Yes  Some recent data might be hidden     Patient questions:  Do you have a fever, pain , or abdominal swelling? No. Pain Score  0 *  Have you tolerated food without any problems? Yes.    Have you been able to return to your normal activities? Yes.    Do you have any questions about your discharge instructions: Diet   No. Medications  No. Follow up visit  No.  Do you have questions or concerns about your Care? No.  Actions: 1. * If pain score is 4 or above:Have you developed a fever since your procedure? no  2.   Have you had an respiratory symptoms (SOB or cough) since your procedure? no  3.   Have you tested positive for COVID 19 since your procedure no  4.   Have you had any family members/close contacts diagnosed with the COVID 19 since your procedure?  no   If yes to any of these questions please route to Laverna Peace, RN and Charlett Lango, RN

## 2019-12-20 DIAGNOSIS — B354 Tinea corporis: Secondary | ICD-10-CM | POA: Diagnosis not present

## 2019-12-20 DIAGNOSIS — B353 Tinea pedis: Secondary | ICD-10-CM | POA: Diagnosis not present

## 2019-12-20 DIAGNOSIS — B352 Tinea manuum: Secondary | ICD-10-CM | POA: Diagnosis not present

## 2019-12-21 DIAGNOSIS — M62830 Muscle spasm of back: Secondary | ICD-10-CM | POA: Diagnosis not present

## 2019-12-21 DIAGNOSIS — M5417 Radiculopathy, lumbosacral region: Secondary | ICD-10-CM | POA: Diagnosis not present

## 2019-12-21 DIAGNOSIS — M545 Low back pain: Secondary | ICD-10-CM | POA: Diagnosis not present

## 2019-12-21 DIAGNOSIS — M542 Cervicalgia: Secondary | ICD-10-CM | POA: Diagnosis not present

## 2020-01-18 ENCOUNTER — Other Ambulatory Visit (INDEPENDENT_AMBULATORY_CARE_PROVIDER_SITE_OTHER): Payer: BC Managed Care – PPO

## 2020-01-18 ENCOUNTER — Ambulatory Visit: Payer: BC Managed Care – PPO | Admitting: Gastroenterology

## 2020-01-18 ENCOUNTER — Encounter: Payer: Self-pay | Admitting: Gastroenterology

## 2020-01-18 VITALS — BP 118/76 | HR 64 | Ht 71.0 in | Wt 203.8 lb

## 2020-01-18 DIAGNOSIS — K2100 Gastro-esophageal reflux disease with esophagitis, without bleeding: Secondary | ICD-10-CM

## 2020-01-18 DIAGNOSIS — R948 Abnormal results of function studies of other organs and systems: Secondary | ICD-10-CM | POA: Diagnosis not present

## 2020-01-18 LAB — LIPASE: Lipase: 75 U/L — ABNORMAL HIGH (ref 11.0–59.0)

## 2020-01-18 MED ORDER — OMEPRAZOLE 40 MG PO CPDR
40.0000 mg | DELAYED_RELEASE_CAPSULE | Freq: Every day | ORAL | 3 refills | Status: DC
Start: 2020-01-18 — End: 2020-09-10

## 2020-01-18 NOTE — Patient Instructions (Addendum)
Please go to the lab this morning to have your lipase checked.   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 Given these results, I recommend that you avoid alcohol and not smoke. Continuing to maintain a health weight is importment.  I also recommend visiting the [email or web address] website for accurate information about Barrett's esophagus.  Please take a medication like omeprazole every day. I recommend taking it 30-60 minutes prior to breakfast.   I recommend another EGD with repeat biopsies of your esophagus to confirm the diagnosis of Barrett's in 3 years.   In addition to omeprazole, modifying diet and lifestyle remains the foundation for treating the symptoms of reflux.   The following strategies help you prevent heartburn and other symptoms by avoiding foods that reduce the effectiveness of the bottom of the esophagus from protecting the esophagus from from acid injury and keeping stomach contents where they belong.  Eat smaller meals. A large meal remains in the stomach for several hours, increasing the chances for gastroesophageal reflux. Try distributing your daily food intake over three, four, or five smaller meals.  Relax when you eat. Stress increases the production of stomach acid, so make meals a pleasant, relaxing experience. Sit down. Eat slowly. Chew completely. Play soothing music.  Relax between meals. Relaxation therapies such as deep breathing, meditation, massage, tai chi, or yoga may help prevent and relieve heartburn.   Remain upright after eating. You should maintain postures that reduce the risk for reflux for at least three hours after eating. For example, dont bend over or strain to lift heavy objects.  Avoid eating within three hours of going to bed. Lying down after eating will increase chances of reflux.  Lose weight. Excess pounds increase pressure on the stomach and can push acid into the esophagus.  Loosen up. Avoid tight belts, waistbands,  and other clothing that puts pressure on your stomach.  Avoid foods that burn. Abstain from food or drink that increases gastric acid secretion, decreases the valve at the bottom of the esophagus, or slows the emptying of the stomach. Known offenders include high-fat foods, spicy dishes, tomatoes and tomato products, citrus fruits, garlic, onions, milk, carbonated drinks, coffee (including decaf), tea, chocolate, mints, and alcohol.  Avoid medications that can predispose you to reflux including aspirin and other NSAIDs, oral contraceptives, hormone therapy drugs, and certain antidepressants.  Sleep at an angle. If youre bothered by nighttime heartburn, place a wedge (available in medical supply stores or a wedge pillow through [employer]) under your upper body. But dont elevate your head with extra pillows. That makes reflux worse by bending you at the waist and compressing your stomach. You might also try sleeping on your left side, as studies have shown this can help--perhaps because the stomach is on the left side of the body, so lying on your left positions most of the stomach below the bottom of the esophagus.  I'd like to see you in the office every year, earlier with any questions or concerns.

## 2020-01-18 NOTE — Progress Notes (Signed)
Referring Provider: Sharlene Dory* Primary Care Physician:  Sharlene Dory, DO  Reason for Consultation: Abdominal pain   IMPRESSION:  Reflux with hiatal hernia    - normal liver enzymes, calcium    - H pylori breath test negative Intestinal metaplasia on EGD    - no endoscopic appearance of Barrett's Esophagus Elevated lipase with normal pancreas on contrasted CT scan    - 102 10/21/19    - 82 11/03/19    - 93 11/08/19    - 75 01/18/20 Fat containing umbilical hernia by CT Mildly elevated triglycerides Recent dermatitis  Reflux with hiatal hernia and gastropathy: Symptoms improving on H2B. Will convert to PPI. Discussed substitution for PPI preferred by his insurance to keep cost down.   Intestinal metaplasia: Long discussion about these findings.  No obvious Barrett's Esophagus endoscopically. He denies any alcohol or smoking. Recommended daily PPI therapy and follow-up EGD with biopsies in 3 years, although surveillance in this setting is not well defined. Hopefully clinical data will emerge to allow for more informed decision making.   Elevated lipase without radiographic evidence for pancreatitis: Recent symptoms may be reflux and gastropathy. Would plan additional evaluation of the pancreas with any symptoms.    Suspected acute pancreatitis +/- reflux. Etiology is unclear. No prior episodes of pancreatitis. No prior abdominal imaging. Symptoms have improved but seems to be lingering.  Repeat labs today. Cross-sectional pancreas imaging.  Continue empiric PPI therapy.   PLAN: Start omeprazole 40 mg QAM Avoid fatty and greasy foods Repeat lipase today EGD in 3 years Annual office follow-up   Please see the "Patient Instructions" section for addition details about the plan.  HPI: Michael George is a 34 y.o. male referred by PA Saguier for  abdominal pain. He was initially seen in consultation 11/08/19. Endoscopy was performed 11/21/19. He returns in scheduled  follow-up.  He has depression and had chronic headaches as a child.  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   At the time of his initial consultation he reported a couple of months of heart burn  with associated chest pain, regurgitation, nausea without vomiting, and shortness of breath.  Pantoprazole 40 mg daily provided some relief. But he continued to feel terrible with eating - indigestion, dizzy and felt like he could pass out. Symptoms last for 10-15 minutes after eating.  Noticed some relief with sitting forwad.    Labs 5/21 showed normal liver enzymes but an elevated lipase at 102.  H pylori breath test negative.  Triglycerides 199.  Labs 6/21: Lipase 82 CT abd/pelvis with contrast 11/11/19:  normal pancreas, small sliding hiatal hernia, small fat containing umbilical hernia EGD 11/21/19: Normal esophagus. Biopsies showed changes for reflux and intestinal metaplasia.  Reactive gastropathy. No H pylori.  Normal duodenum   No change in bowel habits, although has longstanding intermittent loose stools. No known history of gallstones.  No alcohol. No cigarettes/smoking/vaping.  No hyperlipidemia or hypercalcemia. No family history of pancreatitis, pancreatic cancer, or sarcoid. No history of vasculitis. No scorpion bite. No known trauma/injury to the abdomen.  Has used finasteride in the past. Trying shea butter for dermatitis that was previously treated with steroids. Intermittently using creatine.  No other prescription, herbal, or complementary medications.   After his endoscopy, pantoprazole increased to BID dosing, but he could not afford the prescription and did not get the prescription filled. Has been using famotidine and adjusting his diet to avoiding triggering foods, especially spicy foods.  He is overall feeling better. He has no new complaints or concerns.   Past Medical History:  Diagnosis Date  . Back pain 2017   L4-5 transforaminal epidural injection on 07/20/2015    . Compartment syndrome (HCC) 2016  . Depression   . Psoriasis 2014  . Radiculitis 2017   L side, good partial benefit after epidural steriod inj  . Stargardt's disease     Past Surgical History:  Procedure Laterality Date  . BACK SURGERY    . compartment syndrome surgery     bilateral legs   . HAND SURGERY     with pin placement    Current Outpatient Medications  Medication Sig Dispense Refill  . Ascorbic Acid (VITAMIN C) 100 MG tablet Take 100 mg by mouth daily.    . cholecalciferol (VITAMIN D3) 25 MCG (1000 UNIT) tablet Take 1,000 Units by mouth as needed.    . famotidine (PEPCID) 20 MG tablet Take 1 tablet (20 mg total) by mouth daily. (Patient taking differently: Take 20 mg by mouth as needed. ) 30 tablet 3  . finasteride (PROPECIA) 1 MG tablet Take 1 mg by mouth daily. (Patient not taking: Reported on 11/17/2019)    . pantoprazole (PROTONIX) 40 MG tablet Take 1 tablet (40 mg total) by mouth 2 (two) times daily. 60 tablet 3  . Thiamine HCl (VITAMIN B-1) 250 MG tablet Take 250 mg by mouth daily.    Marland Kitchen zinc gluconate 50 MG tablet Take 50 mg by mouth daily.     No current facility-administered medications for this visit.    Allergies as of 01/18/2020  . (No Known Allergies)    Family History  Problem Relation Age of Onset  . Breast cancer Mother   . Uterine cancer Maternal Grandmother   . Diabetes Paternal Grandfather   . Heart attack Paternal Grandfather   . Diabetes Maternal Uncle   . Heart disease Neg Hx   . Colon cancer Neg Hx   . Stomach cancer Neg Hx   . Rectal cancer Neg Hx     Social History   Socioeconomic History  . Marital status: Single    Spouse name: Not on file  . Number of children: Not on file  . Years of education: Not on file  . Highest education level: Not on file  Occupational History  . Occupation: receptionist  Tobacco Use  . Smoking status: Never Smoker  . Smokeless tobacco: Never Used  Substance and Sexual Activity  . Alcohol use:  No  . Drug use: No  . Sexual activity: Not on file  Other Topics Concern  . Not on file  Social History Narrative  . Not on file   Social Determinants of Health   Financial Resource Strain:   . Difficulty of Paying Living Expenses:   Food Insecurity:   . Worried About Programme researcher, broadcasting/film/video in the Last Year:   . Barista in the Last Year:   Transportation Needs:   . Freight forwarder (Medical):   Marland Kitchen Lack of Transportation (Non-Medical):   Physical Activity:   . Days of Exercise per Week:   . Minutes of Exercise per Session:   Stress:   . Feeling of Stress :   Social Connections:   . Frequency of Communication with Friends and Family:   . Frequency of Social Gatherings with Friends and Family:   . Attends Religious Services:   . Active Member of Clubs or Organizations:   . Attends  Club or Organization Meetings:   Marland Kitchen Marital Status:   Intimate Partner Violence:   . Fear of Current or Ex-Partner:   . Emotionally Abused:   Marland Kitchen Physically Abused:   . Sexually Abused:     Physical Exam: General:   Alert,  well-nourished, pleasant and cooperative in NAD Head:  Normocephalic and atraumatic. Eyes:  Sclera clear, no icterus.   Conjunctiva pink. Ears:  Normal auditory acuity. Nose:  No deformity, discharge,  or lesions. Mouth:  No deformity or lesions.   Neck:  Supple; no masses or thyromegaly. Lungs:  Clear throughout to auscultation.   No wheezes. Heart:  Regular rate and rhythm; no murmurs. Abdomen:  Soft, nontender - I am unable to reproduce his abdominal pain, nondistended, normal bowel sounds, no rebound or guarding. No hepatosplenomegaly.   Rectal:  Deferred  Msk:  Symmetrical. No boney deformities LAD: No inguinal or umbilical LAD Extremities:  No clubbing or edema. Neurologic:  Alert and  oriented x4;  grossly nonfocal Skin:  Intact without significant lesions or rashes. Psych:  Alert and cooperative. Normal mood and affect.   Jonnelle Lawniczak L. Orvan Falconer, MD,  MPH 01/18/2020, 8:34 AM

## 2020-01-25 DIAGNOSIS — M545 Low back pain: Secondary | ICD-10-CM | POA: Diagnosis not present

## 2020-01-25 DIAGNOSIS — M62830 Muscle spasm of back: Secondary | ICD-10-CM | POA: Diagnosis not present

## 2020-01-25 DIAGNOSIS — M542 Cervicalgia: Secondary | ICD-10-CM | POA: Diagnosis not present

## 2020-01-25 DIAGNOSIS — M5417 Radiculopathy, lumbosacral region: Secondary | ICD-10-CM | POA: Diagnosis not present

## 2020-02-14 ENCOUNTER — Encounter: Payer: Self-pay | Admitting: Family Medicine

## 2020-02-14 ENCOUNTER — Telehealth: Payer: BC Managed Care – PPO | Admitting: Family Medicine

## 2020-02-14 ENCOUNTER — Other Ambulatory Visit: Payer: Self-pay

## 2020-02-14 VITALS — BP 110/72 | HR 82 | Temp 97.9°F | Ht 72.0 in | Wt 204.2 lb

## 2020-02-14 DIAGNOSIS — M792 Neuralgia and neuritis, unspecified: Secondary | ICD-10-CM

## 2020-02-14 MED ORDER — VALACYCLOVIR HCL 1 G PO TABS: 1000.0000 mg | ORAL_TABLET | Freq: Three times a day (TID) | ORAL | 0 refills | Status: AC

## 2020-02-14 MED ORDER — PREDNISONE 20 MG PO TABS: 40.0000 mg | ORAL_TABLET | Freq: Every day | ORAL | 0 refills | Status: AC

## 2020-02-14 NOTE — Patient Instructions (Signed)
If a rash develops, let me know and take the Valtrex. Take the prednisone right away.  Let me know if things are not turning the corner or if you are experiencing new symptoms.   Let us know if you need anything.

## 2020-02-14 NOTE — Progress Notes (Signed)
Chief Complaint  Patient presents with  . Flank Pain    left side/more burning    Subjective: Patient is a 34 y.o. male here for abd pain.  L sided abd pain for 5 d. Burning pain, sometimes sharp. Sometimes radiates down. More gassy. Bowel movements erratic, no a/w relief of s/s's. No big dietary changes. No N/V. Tried taking ibuprofen at home that did not sig help. No fevers, urinary or skin complaints. Sometimes rubbing and sometimes cold on it makes it better. Feels it more when he is sitting or walking.   Past Medical History:  Diagnosis Date  . Back pain 2017   L4-5 transforaminal epidural injection on 07/20/2015  . Compartment syndrome (HCC) 2016  . Depression   . Psoriasis 2014  . Radiculitis 2017   L side, good partial benefit after epidural steriod inj  . Stargardt's disease     Objective: BP 110/72 (BP Location: Left Arm, Patient Position: Sitting, Cuff Size: Normal)   Pulse 82   Temp 97.9 F (36.6 C) (Oral)   Ht 6' (1.829 m)   Wt 204 lb 4 oz (92.6 kg)   SpO2 95%   BMI 27.70 kg/m  General: Awake, appears stated age HEENT: MMM, EOMi Heart: RRR Lungs: CTAB, no rales, wheezes or rhonchi. No accessory muscle use Skin: No lesions noted Abd: S, NT, ND Psych: Age appropriate judgment and insight, normal affect and mood  Assessment and Plan: Neuralgia - Plan: predniSONE (DELTASONE) 20 MG tablet, valACYclovir (VALTREX) 1000 MG tablet  He has had increased stress, could be early onset of Shingles. Will take Valtrex if rash arises. Start pred. Let me know if anything changes.  The patient voiced understanding and agreement to the plan.  Jilda Roche [place], DO 02/14/20  12:25 PM

## 2020-02-20 ENCOUNTER — Telehealth: Payer: Self-pay

## 2020-02-20 ENCOUNTER — Emergency Department (HOSPITAL_BASED_OUTPATIENT_CLINIC_OR_DEPARTMENT_OTHER)
Admission: EM | Admit: 2020-02-20 | Discharge: 2020-02-20 | Disposition: A | Discharge status: Home / Self Care | Payer: BC Managed Care – PPO | Attending: Emergency Medicine | Admitting: Emergency Medicine

## 2020-02-20 ENCOUNTER — Other Ambulatory Visit: Payer: Self-pay

## 2020-02-20 ENCOUNTER — Emergency Department (HOSPITAL_BASED_OUTPATIENT_CLINIC_OR_DEPARTMENT_OTHER): Payer: BC Managed Care – PPO

## 2020-02-20 ENCOUNTER — Encounter (HOSPITAL_BASED_OUTPATIENT_CLINIC_OR_DEPARTMENT_OTHER): Payer: Self-pay | Admitting: *Deleted

## 2020-02-20 DIAGNOSIS — R0789 Other chest pain: Secondary | ICD-10-CM | POA: Insufficient documentation

## 2020-02-20 DIAGNOSIS — R079 Chest pain, unspecified: Secondary | ICD-10-CM | POA: Diagnosis not present

## 2020-02-20 LAB — TROPONIN I (HIGH SENSITIVITY)
Troponin I (High Sensitivity): <2 ng/L (ref <18)
Troponin I (High Sensitivity): <2 ng/L (ref <18)

## 2020-02-20 LAB — LIPASE, BLOOD: Lipase: 65 U/L — ABNORMAL HIGH (ref 11–51)

## 2020-02-20 LAB — COMPREHENSIVE METABOLIC PANEL
ALT: 22 U/L (ref 0–44)
AST: 22 U/L (ref 15–41)
Albumin: 4.9 g/dL (ref 3.5–5.0)
Alkaline Phosphatase: 49 U/L (ref 38–126)
Anion gap: 16 — ABNORMAL HIGH (ref 5–15)
BUN: 16 mg/dL (ref 6–20)
CO2: 25 mmol/L (ref 22–32)
Calcium: 9.1 mg/dL (ref 8.9–10.3)
Chloride: 99 mmol/L (ref 98–111)
Creatinine, Ser: 1.37 mg/dL — ABNORMAL HIGH (ref 0.61–1.24)
GFR calc Af Amer: >60 mL/min (ref >60)
GFR calc non Af Amer: >60 mL/min (ref >60)
Glucose, Bld: 97 mg/dL (ref 70–99)
Potassium: 3.8 mmol/L (ref 3.5–5.1)
Sodium: 140 mmol/L (ref 135–145)
Total Bilirubin: 1.1 mg/dL (ref 0.3–1.2)
Total Protein: 7.6 g/dL (ref 6.5–8.1)

## 2020-02-20 LAB — CBC WITH DIFFERENTIAL/PLATELET
Abs Immature Granulocytes: 0.04 10*3/uL (ref 0.00–0.07)
Basophils Absolute: 0 10*3/uL (ref 0.0–0.1)
Basophils Relative: 0 %
Eosinophils Absolute: 0.1 10*3/uL (ref 0.0–0.5)
Eosinophils Relative: 2 %
HCT: 47.8 % (ref 39.0–52.0)
Hemoglobin: 16.5 g/dL (ref 13.0–17.0)
Immature Granulocytes: 1 %
Lymphocytes Relative: 21 %
Lymphs Abs: 1.4 10*3/uL (ref 0.7–4.0)
MCH: 31.3 pg (ref 26.0–34.0)
MCHC: 34.5 g/dL (ref 30.0–36.0)
MCV: 90.5 fL (ref 80.0–100.0)
Monocytes Absolute: 0.6 10*3/uL (ref 0.1–1.0)
Monocytes Relative: 9 %
Neutro Abs: 4.6 10*3/uL (ref 1.7–7.7)
Neutrophils Relative %: 67 %
Platelets: 214 10*3/uL (ref 150–400)
RBC: 5.28 MIL/uL (ref 4.22–5.81)
RDW: 12.7 % (ref 11.5–15.5)
WBC: 6.8 10*3/uL (ref 4.0–10.5)
nRBC: 0 % (ref 0.0–0.2)

## 2020-02-20 NOTE — ED Provider Notes (Signed)
[facility] EMERGENCY DEPARTMENT Provider Note   CSN: [id number] Arrival date & time: 02/20/20  1344     History Chief Complaint  Patient presents with   Chest Pain    Michael George is a 34 y.o. male with past medical history of GERD, presenting to the emergency department with complaint of chest pain.  Patient states symptoms began initially a few days ago while he was running and he felt palpitations and a sharp left-sided chest pain.  He states last night when he laid down to go to sleep he had recurrence of symptoms and felt as though his heart was "jumping out of his chest."  He states this morning he had some sharp pain around lunchtime though it is dull and achy now.  He states it is worse with laying down and better when he stands up and begins moving.  He denies associated shortness of breath or nausea.  He states his GERD has been fairly well controlled lately.  He is being followed by GI for elevated lipase and GERD.  He denies any recent illnesses.  No unilateral leg pain or swelling, no history of DVT or PE.  No cardiac history.  The history is provided by the patient.       Past Medical History:  Diagnosis Date   Back pain 2017   L4-5 transforaminal epidural injection on 07/20/2015   Compartment syndrome (HCC) 2016   Depression    Psoriasis 2014   Radiculitis 2017   L side, good partial benefit after epidural steriod inj   Stargardt's disease     Patient Active Problem List   Diagnosis Date Noted   Psoriasis 09/17/2012   Chest pain, atypical 11/04/2011   MACULAR DEGENERATION 03/04/2007    Past Surgical History:  Procedure Laterality Date   BACK SURGERY     compartment syndrome surgery     bilateral legs    HAND SURGERY     with pin placement       Family History  Problem Relation Age of Onset   Breast cancer Mother    Uterine cancer Maternal Grandmother    Diabetes Paternal Grandfather    Heart attack Paternal  Grandfather    Diabetes Maternal Uncle    Heart disease Neg Hx    Colon cancer Neg Hx    Stomach cancer Neg Hx    Rectal cancer Neg Hx     Social History   Tobacco Use   Smoking status: Never Smoker   Smokeless tobacco: Never Used  Substance Use Topics   Alcohol use: No   Drug use: No    Home Medications Prior to Admission medications   Medication Sig Start Date End Date Taking? Authorizing Provider  Ascorbic Acid (VITAMIN C) 100 MG tablet Take 100 mg by mouth daily.   Yes [provider]  cholecalciferol (VITAMIN D3) 25 MCG (1000 UNIT) tablet Take 1,000 Units by mouth as needed.   Yes [provider]  finasteride (PROPECIA) 1 MG tablet Take 1 mg by mouth at bedtime.    Yes [provider]  omeprazole (PRILOSEC) 40 MG capsule Take 1 capsule (40 mg total) by mouth daily. 01/18/20  Yes Tressia Danas, MD  Thiamine HCl (VITAMIN B-1) 250 MG tablet Take 250 mg by mouth daily.   Yes [provider]  zinc gluconate 50 MG tablet Take 50 mg by mouth daily.   Yes [provider]  valACYclovir (VALTREX) 1000 MG tablet Take 1 tablet (1,000  mg total) by mouth 3 (three) times daily for 7 days. 02/14/20 02/21/20  Sharlene Dory, DO    Allergies    Patient has no known allergies.  Review of Systems   Review of Systems  All other systems reviewed and are negative.   Physical Exam Updated Vital Signs BP 120/78    Pulse 71    Temp 98.5 F (36.9 C) (Oral)    Resp 15    Ht 6' (1.829 m)    Wt 92.5 kg    SpO2 96%    BMI 27.67 kg/m   Physical Exam Vitals and nursing note reviewed.  Constitutional:      General: He is not in acute distress.    Appearance: He is well-developed.  HENT:     Head: Normocephalic and atraumatic.  Eyes:     Conjunctiva/sclera: Conjunctivae normal.  Cardiovascular:     Rate and Rhythm: Normal rate and regular rhythm.     Heart sounds: Normal heart sounds.     Comments: Intact DP pulses  bilaterally Pulmonary:     Effort: Pulmonary effort is normal.     Breath sounds: Normal breath sounds.  Chest:     Chest wall: No tenderness.  Abdominal:     General: Bowel sounds are normal.     Palpations: Abdomen is soft.  Musculoskeletal:     Right lower leg: No edema.     Left lower leg: No edema.     Comments: No calf tenderness.    Skin:    General: Skin is warm.  Neurological:     Mental Status: He is alert.  Psychiatric:        Behavior: Behavior normal.     ED Results / Procedures / Treatments   Labs (all labs ordered are listed, but only abnormal results are displayed) Labs Reviewed  COMPREHENSIVE METABOLIC PANEL - Abnormal; Notable for the following components:      Result Value   Creatinine, Ser 1.37 (*)    Anion gap 16 (*)    All other components within normal limits  LIPASE, BLOOD - Abnormal; Notable for the following components:   Lipase 65 (*)    All other components within normal limits  CBC WITH DIFFERENTIAL/PLATELET  TROPONIN I (HIGH SENSITIVITY)  TROPONIN I (HIGH SENSITIVITY)    EKG EKG Interpretation  Date/Time:  Monday February 20 2020 13:49:46 EDT Ventricular Rate:  75 PR Interval:  152 QRS Duration: 90 QT Interval:  392 QTC Calculation: 437 R Axis:   105 Text Interpretation: Normal sinus rhythm with sinus arrhythmia Rightward axis Borderline ECG since last tracing no significant change Confirmed by Rolan Bucco [id number]) on 02/20/2020 6:09:24 PM   Radiology DG Chest 2 View  Result Date: 02/20/2020 CLINICAL DATA:  Left-sided anterior chest pain. EXAM: CHEST - 2 VIEW COMPARISON:  Oct 21, 2019 FINDINGS: The heart size and mediastinal contours are within normal limits. Both lungs are clear. The visualized skeletal structures are unremarkable. IMPRESSION: No active cardiopulmonary disease. Electronically Signed   By: Aram Candela M.D.   On: 02/20/2020 17:10    Procedures Procedures (including critical care time)  Medications  Ordered in ED Medications - No data to display  ED Course  I have reviewed the triage vital signs and the nursing notes.  Pertinent labs & imaging results that were available during my care of the patient were reviewed by me and considered in my medical decision making (see chart for details).  Clinical Course as of Feb 19 1938  Mon Feb 20, 2020  1817 Pt ambulated in the ED without recurrent symptoms.    [JR]    Clinical Course User Index [JR] Myrl Bynum, [place] N, PA-C   MDM Rules/Calculators/A&P                          Patient presenting with intermittent left-sided chest pain over the last few days.  Initially began while he was jogging, since that time it is better with standing up and moving around, worse with laying down.  He does not have risk factors for PE or CAD.  His vital signs are normal throughout ED stay with excellent O2 saturation on room air, no tachycardia or tachypnea.  EKG is nonischemic.  Chest x-ray is negative.  Troponins are negative x2.  No leukocytosis.  Metabolic panel with creatinine at baseline of 1.37, otherwise unremarkable.  Lipase is 65 which has been elevated over the last few months, this is improved since previous.  Patient is ambulated in the ED without recurrence of symptoms.  Discussed reassuring work-up.  Low suspicion for PE or ACS.  Considered pericarditis, however seems less likely given workup and presentation.  Discussed importance of close follow-up with PCP and strict return precautions.  He verbalized understanding agrees with care plan at this time.  Patient discussed with Dr. Fredderick Phenix, agrees with work-up and care plan.  Discussed results, findings, treatment and follow up. Patient advised of return precautions. Patient verbalized understanding and agreed with plan.  Final Clinical Impression(s) / ED Diagnoses Final diagnoses:  Intermittent left-sided chest pain    Rx / DC Orders ED Discharge Orders    None       Isatu Macinnes, [place] N,  PA-C 02/20/20 1943    Rolan Bucco, MD 02/20/20 2342

## 2020-02-20 NOTE — ED Notes (Signed)
Patient currently in X-ray

## 2020-02-20 NOTE — Discharge Instructions (Signed)
Please read instructions below. °Follow up with your primary care provider this week. ° Return to the ER for new or worsening symptoms; including worsening chest pain, shortness of breath, pain that radiates to the arm or neck, pain or shortness of breath worsened with exertion.  ° °

## 2020-02-20 NOTE — Telephone Encounter (Signed)
Nurse Assessment Nurse: Anner Crete, RN, Olegario Messier Date/Time (Eastern Time): 02/20/2020 1:31:01 PM Confirm and document reason for call. If symptomatic, describe symptoms. ---Caller stated he is having chest pain, it is sharp and intermittent . Has the patient had close contact with a person known or suspected to have the novel coronavirus illness OR traveled / lives in area with major community spread (including international travel) in the last 14 days from the onset of symptoms? * If Asymptomatic, screen for exposure and travel within the last 14 days. ---No Does the patient have any new or worsening symptoms? ---Yes Will a triage be completed? ---Yes Related visit to physician within the last 2 weeks? ---No Does the PT have any chronic conditions? (i.e. diabetes, asthma, this includes High risk factors for pregnancy, etc.) ---No Is this a behavioral health or substance abuse call? ---No Guidelines Guideline Title Affirmed Question Affirmed Notes Nurse Date/Time Michael George Time) Chest Pain SEVERE chest pain Anner Crete, RN, Olegario Messier 02/20/2020 1:32:16 PM Disp. Time Michael George Time) Disposition Final User 02/20/2020 1:30:06 PM Send to Urgent [name] 02/20/2020 1:34:55 PM Go to ED Now Yes Anner Crete, RN, Olegario Messier PLEASE NOTE: All timestamps contained within this report are represented as [place] Standard Time. CONFIDENTIALTY NOTICE: This fax transmission is intended only for the addressee. It contains information that is legally privileged, confidential or otherwise protected from use or disclosure. If you are not the intended recipient, you are strictly prohibited from reviewing, disclosing, copying using or disseminating any of this information or taking any action in reliance on or regarding this information. If you have received this fax in error, please notify us immediately by telephone so that we can arrange for its return to [place]. Phone: [phone], Toll-Free: [phone], Fax: [phone] Page: 2  of 2 Call Id: [id number] Caller Disagree/Comply Comply Caller Understands Yes PreDisposition Call Doctor Care Advice Given Per Guideline GO TO ED NOW: * You need to be seen in the Emergency Department. * Another adult should drive. NOTHING BY MOUTH: * Do not eat or drink anything for now. CARE ADVICE given per Chest Pain (Adult) guideline. Referrals GO TO FACILITY OTHER - SPECIFY

## 2020-02-20 NOTE — ED Triage Notes (Signed)
Left lateral chest pain. He was seen for same here a few weeks ago with normal findings on evaluation. Last night laying on his right side his heart was beating fast and irregular.

## 2020-03-15 DIAGNOSIS — E538 Deficiency of other specified B group vitamins: Secondary | ICD-10-CM | POA: Diagnosis not present

## 2020-03-15 DIAGNOSIS — K219 Gastro-esophageal reflux disease without esophagitis: Secondary | ICD-10-CM | POA: Diagnosis not present

## 2020-03-15 DIAGNOSIS — R0789 Other chest pain: Secondary | ICD-10-CM | POA: Diagnosis not present

## 2020-03-15 DIAGNOSIS — F419 Anxiety disorder, unspecified: Secondary | ICD-10-CM | POA: Diagnosis not present

## 2020-03-15 DIAGNOSIS — E559 Vitamin D deficiency, unspecified: Secondary | ICD-10-CM | POA: Diagnosis not present

## 2020-03-15 DIAGNOSIS — R748 Abnormal levels of other serum enzymes: Secondary | ICD-10-CM | POA: Diagnosis not present

## 2020-03-15 DIAGNOSIS — F439 Reaction to severe stress, unspecified: Secondary | ICD-10-CM | POA: Diagnosis not present

## 2020-03-20 ENCOUNTER — Other Ambulatory Visit: Payer: Self-pay

## 2020-03-20 ENCOUNTER — Encounter: Payer: Self-pay | Admitting: Family Medicine

## 2020-03-20 ENCOUNTER — Ambulatory Visit: Payer: BC Managed Care – PPO | Admitting: Family Medicine

## 2020-03-20 VITALS — BP 104/68 | HR 65 | Temp 97.8°F | Ht 71.5 in | Wt 199.4 lb

## 2020-03-20 DIAGNOSIS — R300 Dysuria: Secondary | ICD-10-CM

## 2020-03-20 DIAGNOSIS — R52 Pain, unspecified: Secondary | ICD-10-CM

## 2020-03-20 LAB — URINALYSIS
Bilirubin Urine: NEGATIVE
Glucose, UA: NEGATIVE
Hgb urine dipstick: NEGATIVE
Ketones, ur: NEGATIVE
Leukocytes,Ua: NEGATIVE
Nitrite: NEGATIVE
Protein, ur: NEGATIVE
Specific Gravity, Urine: 1.022 (ref 1.001–1.03)
pH: 6.5 (ref 5.0–8.0)

## 2020-03-20 MED ORDER — GABAPENTIN 300 MG PO CAPS: 300.0000 mg | ORAL_CAPSULE | Freq: Three times a day (TID) | ORAL | 3 refills | Status: DC

## 2020-03-20 NOTE — Addendum Note (Signed)
Addended by: Scharlene Gloss B on: 03/20/2020 10:44 AM   Modules accepted: Orders

## 2020-03-20 NOTE — Patient Instructions (Addendum)
Send me a message in 3 weeks if we aren't turning the corner on the new medication. Try it at night first. You can add during the day as needed.   Let me know if there are cost issues. There should not be.  We will be in touch regarding your urine sample. Stay hydrated.   Let us know if you need anything.

## 2020-03-20 NOTE — Progress Notes (Signed)
Chief Complaint  Patient presents with  . Follow-up    Subjective: Patient is a 34 y.o. male here for f/u.  Patient is here to follow-up for left-sided burning pain. I thought he could have had the beginnings of shingles and sent in Valtrex and placed on a prednisone burst. I do not believe he needed to take the Valtrex but the prednisone did help. Symptoms returned once he came off of it. Ibuprofen was not helpful. Symptoms have overall improved but he is still having the issue, he notices it most when he is laying down. He notices it the least when he is working out. No new skin changes. Bowel movements are normal and his reflux is currently well controlled. He has associated umbilical abdominal pain associated with this. No unintentional weight changes, bleeding, nausea, or vomiting.  The patient has been having slight burning with urination. He notices the urine feels warmer when he voids. He is not circumcised. He has had 1 UTI before, but felt much worse than this. No urgency, freq, bleeding, fevers, flank pain, or d/c.   Past Medical History:  Diagnosis Date  . Back pain 2017   L4-5 transforaminal epidural injection on 07/20/2015  . Compartment syndrome (HCC) 2016  . Depression   . Psoriasis 2014  . Radiculitis 2017   L side, good partial benefit after epidural steriod inj  . Stargardt's disease     Objective: BP 104/68 (BP Location: Right Arm, Patient Position: Sitting, Cuff Size: Normal)   Pulse 65   Temp 97.8 F (36.6 C) (Oral)   Ht 5' 11.5" (1.816 m)   Wt 199 lb 6 oz (90.4 kg)   SpO2 95%   BMI 27.42 kg/m  General: Awake, appears stated age Heart: RRR Lungs: CTAB, no rales, wheezes or rhonchi. No accessory muscle use MSK: No ttp over L side Abd: BS+, S, mild periumb pain, ND Psych: Age appropriate judgment and insight, normal affect and mood  Assessment and Plan: Burning pain - Plan: gabapentin (NEURONTIN) 300 MG capsule  Dysuria - Plan: Urinalysis  1. Based on  his response, will trial gabapentin 300 mg TID, though I mainly want him to use it at night to see how he does. He has overall gotten better so I doubt he will use this lifelong.  2. Ck UA and cx. Stay hydrated.  The patient voiced understanding and agreement to the plan.  Jilda Roche [place], DO 03/20/20  10:39 AM

## 2020-03-21 LAB — URINE CULTURE
MICRO NUMBER:: 11089794
SPECIMEN QUALITY:: ADEQUATE

## 2020-04-11 DIAGNOSIS — R748 Abnormal levels of other serum enzymes: Secondary | ICD-10-CM | POA: Diagnosis not present

## 2020-04-11 DIAGNOSIS — R0789 Other chest pain: Secondary | ICD-10-CM | POA: Diagnosis not present

## 2020-04-11 DIAGNOSIS — F419 Anxiety disorder, unspecified: Secondary | ICD-10-CM | POA: Diagnosis not present

## 2020-04-11 DIAGNOSIS — F32A Depression, unspecified: Secondary | ICD-10-CM | POA: Diagnosis not present

## 2020-06-29 ENCOUNTER — Ambulatory Visit: Payer: BC Managed Care – PPO | Admitting: Family Medicine

## 2020-06-29 ENCOUNTER — Other Ambulatory Visit: Payer: Self-pay

## 2020-06-29 ENCOUNTER — Encounter: Payer: Self-pay | Admitting: Family Medicine

## 2020-06-29 VITALS — BP 118/72 | HR 97 | Resp 17 | Ht 71.5 in | Wt 207.0 lb

## 2020-06-29 DIAGNOSIS — R0789 Other chest pain: Secondary | ICD-10-CM

## 2020-06-29 DIAGNOSIS — M542 Cervicalgia: Secondary | ICD-10-CM | POA: Diagnosis not present

## 2020-06-29 NOTE — Patient Instructions (Signed)
Heat (pad or rice pillow in microwave) over affected area, 10-15 minutes twice daily.   Ice/cold pack over area for 10-15 min twice daily.  OK to take Tylenol 1000 mg (2 extra strength tabs) or 975 mg (3 regular strength tabs) every 6 hours as needed.  Send me a message in around 1 month if you are still having issues and we will get you set up with the sports medicine team. d  Let us know if you need anything.   Pectoralis Major Rehab Ask your health care provider which exercises are safe for you. Do exercises exactly as told by your health care provider and adjust them as directed. It is normal to feel mild stretching, pulling, tightness, or discomfort as you do these exercises, but you should stop right away if you feel sudden pain or your pain gets worse.Do not begin these exercises until told by your health care provider. Stretching and range of motion exercises These exercises warm up your muscles and joints and improve the movement and flexibility of your shoulder. These exercises can also help to relieve pain, numbness, and tingling. Exercise A: Pendulum  1. Stand near a wall or a surface that you can hold onto for balance. 2. Bend at the waist and let your left / right arm hang straight down. Use your other arm to keep your balance. 3. Relax your arm and shoulder muscles, and move your hips and your trunk so your left / right arm swings freely. Your arm should swing because of the motion of your body, not because you are using your arm or shoulder muscles. 4. Keep moving so your arm swings in the following directions, as told by your health care provider: ? Side to side. ? Forward and backward. ? In clockwise and counterclockwise circles. 5. Slowly return to the starting position. Repeat 2 times. Complete this exercise 3 times per week. Exercise B: Abduction, standing 1. Stand and hold a broomstick, a cane, or a similar object. Place your hands a little more than shoulder-width  apart on the object. Your left / right hand should be palm-up, and your other hand should be palm-down. 2. While keeping your elbow straight and your shoulder muscles relaxed, push the stick across your body toward your left / right side. Raise your left / right arm to the side of your body and then over your head until you feel a stretch in your shoulder. ? Stop when you reach the angle that is recommended by your health care provider. ? Avoid shrugging your shoulder while you raise your arm. Keep your shoulder blade tucked down toward the middle of your spine. 3. Hold for 10 seconds. 4. Slowly return to the starting position. Repeat 2 times. Complete this exercise 3 times per week. Exercise C: Wand flexion, supine  1. Lie on your back. You may bend your knees for comfort. 2. Hold a broomstick, a cane, or a similar object so that your hands are about shoulder-width apart on the object. Your palms should face toward your feet. 3. Raise your left / right arm in front of your face, then behind your head (toward the floor). Use your other hand to help you do this. Stop when you feel a gentle stretch in your shoulder, or when you reach the angle that is recommended by your health care provider. 4. Hold for 3 seconds. 5. Use the broomstick and your other arm to help you return your left / right arm to the starting  position. Repeat 2 times. Complete this exercise 3 times per week. Exercise D: Wand shoulder external rotation 1. Stand and hold a broomstick, a cane, or a similar object so your handsare about shoulder-width apart on the object. 2. Start with your arms hanging down, then bend both elbows to an "L" shape (90 degrees). 3. Keep your left / right elbow at your side. Use your other hand to push the stick so your left / right forearm moves away from your body, out to your side. ? Keep your left / right elbow bent to 90 degrees and keep it against your side. ? Stop when you feel a gentle stretch  in your shoulder, or when you reach the angle recommended by your health care provider. 4. Hold for 10 seconds. 5. Use the stick to help you return your left / right arm to the starting position. Repeat 2 times. Complete this exercise 3 times per week. Strengthening exercises These exercises build strength and endurance in your shoulder. Endurance is the ability to use your muscles for a long time, even after your muscles get tired. Exercise E: Scapular protraction, standing 1. Stand so you are facing a wall. Place your feet about one arm-length away from the wall. 2. Place your hands on the wall and straighten your elbows. 3. Keep your hands on the wall as you push your upper back away from the wall. You should feel your shoulder blades sliding forward.Keep your elbows and your head still. ? If you are not sure that you are doing this exercise correctly, ask your health care provider for more instructions. 4. Hold for 3 seconds. 5. Slowly return to the starting position. Let your muscles relax completely before you repeat this exercise. Repeat 2 times. Complete this exercise 3 times per week. Exercise F: Shoulder blade squeezes  (scapular retraction) 1. Sit with good posture in a stable chair. Do not let your back touch the back of the chair. 2. Your arms should be at your sides with your elbows bent. You may rest your forearms on a pillow if that is more comfortable. 3. Squeeze your shoulder blades together. Bring them down and back. ? Keep your shoulders level. ? Do not lift your shoulders up toward your ears. 4. Hold for 3 seconds. 5. Return to the starting position. Repeat 2 times. Complete this exercise 3 times per week. This information is not intended to replace advice given to you by your health care provider. Make sure you discuss any questions you have with your health care provider. Document Released: 05/19/2005 Document Revised: 02/28/2016 Document Reviewed:  02/04/2015 [employer] Interactive Patient Education  2018 [employer]  EXERCISES RANGE OF MOTION (ROM) AND STRETCHING EXERCISES  These exercises may help you when beginning to rehabilitate your issue. In order to successfully resolve your symptoms, you must improve your posture. These exercises are designed to help reduce the forward-head and rounded-shoulder posture which contributes to this condition. Your symptoms may resolve with or without further involvement from your physician, physical therapist or athletic trainer. While completing these exercises, remember:   Restoring tissue flexibility helps normal motion to return to the joints. This allows healthier, less painful movement and activity.  An effective stretch should be held for at least 20 seconds, although you may need to begin with shorter hold times for comfort.  A stretch should never be painful. You should only feel a gentle lengthening or release in the stretched tissue.  Do not do any stretch or exercise  that you cannot tolerate.  STRETCH- Axial Extensors  Lie on your back on the floor. You may bend your knees for comfort. Place a rolled-up hand towel or dish towel, about 2 inches in diameter, under the part of your head that makes contact with the floor.  Gently tuck your chin, as if trying to make a "double chin," until you feel a gentle stretch at the base of your head.  Hold 15-20 seconds. Repeat 2-3 times. Complete this exercise 1 time per day.   STRETCH - Axial Extension   Stand or sit on a firm surface. Assume a good posture: chest up, shoulders drawn back, abdominal muscles slightly tense, knees unlocked (if standing) and feet hip width apart.  Slowly retract your chin so your head slides back and your chin slightly lowers. Continue to look straight ahead.  You should feel a gentle stretch in the back of your head. Be certain not to feel an aggressive stretch since this can cause headaches later.  Hold for  15-20 seconds. Repeat 2-3 times. Complete this exercise 1 time per day.  STRETCH - Cervical Side Bend   Stand or sit on a firm surface. Assume a good posture: chest up, shoulders drawn back, abdominal muscles slightly tense, knees unlocked (if standing) and feet hip width apart.  Without letting your nose or shoulders move, slowly tip your right / left ear to your shoulder until your feel a gentle stretch in the muscles on the opposite side of your neck.  Hold 15-20 seconds. Repeat 2-3 times. Complete this exercise 1-2 times per day.  STRETCH - Cervical Rotators   Stand or sit on a firm surface. Assume a good posture: chest up, shoulders drawn back, abdominal muscles slightly tense, knees unlocked (if standing) and feet hip width apart.  Keeping your eyes level with the ground, slowly turn your head until you feel a gentle stretch along the back and opposite side of your neck.  Hold 15-20 seconds. Repeat 2-3 times. Complete this exercise 1-2 times per day.  RANGE OF MOTION - Neck Circles   Stand or sit on a firm surface. Assume a good posture: chest up, shoulders drawn back, abdominal muscles slightly tense, knees unlocked (if standing) and feet hip width apart.  Gently roll your head down and around from the back of one shoulder to the back of the other. The motion should never be forced or painful.  Repeat the motion 10-20 times, or until you feel the neck muscles relax and loosen. Repeat 2-3 times. Complete the exercise 1-2 times per day. STRENGTHENING EXERCISES - Cervical Strain and Sprain These exercises may help you when beginning to rehabilitate your injury. They may resolve your symptoms with or without further involvement from your physician, physical therapist, or athletic trainer. While completing these exercises, remember:   Muscles can gain both the endurance and the strength needed for everyday activities through controlled exercises.  Complete these exercises as  instructed by your physician, physical therapist, or athletic trainer. Progress the resistance and repetitions only as guided.  You may experience muscle soreness or fatigue, but the pain or discomfort you are trying to eliminate should never worsen during these exercises. If this pain does worsen, stop and make certain you are following the directions exactly. If the pain is still present after adjustments, discontinue the exercise until you can discuss the trouble with your clinician.  STRENGTH - Cervical Flexors, Isometric  Face a wall, standing about 6 inches away. Place a small  pillow, a ball about 6-8 inches in diameter, or a folded towel between your forehead and the wall.  Slightly tuck your chin and gently push your forehead into the soft object. Push only with mild to moderate intensity, building up tension gradually. Keep your jaw and forehead relaxed.  Hold 10 to 20 seconds. Keep your breathing relaxed.  Release the tension slowly. Relax your neck muscles completely before you start the next repetition. Repeat 2-3 times. Complete this exercise 1 time per day.  STRENGTH- Cervical Lateral Flexors, Isometric   Stand about 6 inches away from a wall. Place a small pillow, a ball about 6-8 inches in diameter, or a folded towel between the side of your head and the wall.  Slightly tuck your chin and gently tilt your head into the soft object. Push only with mild to moderate intensity, building up tension gradually. Keep your jaw and forehead relaxed.  Hold 10 to 20 seconds. Keep your breathing relaxed.  Release the tension slowly. Relax your neck muscles completely before you start the next repetition. Repeat 2-3 times. Complete this exercise 1 time per day.  STRENGTH - Cervical Extensors, Isometric   Stand about 6 inches away from a wall. Place a small pillow, a ball about 6-8 inches in diameter, or a folded towel between the back of your head and the wall.  Slightly tuck your  chin and gently tilt your head back into the soft object. Push only with mild to moderate intensity, building up tension gradually. Keep your jaw and forehead relaxed.  Hold 10 to 20 seconds. Keep your breathing relaxed.  Release the tension slowly. Relax your neck muscles completely before you start the next repetition. Repeat 2-3 times. Complete this exercise 1 time per day.  POSTURE AND BODY MECHANICS CONSIDERATIONS Keeping correct posture when sitting, standing or completing your activities will reduce the stress put on different body tissues, allowing injured tissues a chance to heal and limiting painful experiences. The following are general guidelines for improved posture. Your physician or physical therapist will provide you with any instructions specific to your needs. While reading these guidelines, remember:  The exercises prescribed by your provider will help you have the flexibility and strength to maintain correct postures.  The correct posture provides the optimal environment for your joints to work. All of your joints have less wear and tear when properly supported by a spine with good posture. This means you will experience a healthier, less painful body.  Correct posture must be practiced with all of your activities, especially prolonged sitting and standing. Correct posture is as important when doing repetitive low-stress activities (typing) as it is when doing a single heavy-load activity (lifting).  PROLONGED STANDING WHILE SLIGHTLY LEANING FORWARD When completing a task that requires you to lean forward while standing in one place for a long time, place either foot up on a stationary 2- to 4-inch high object to help maintain the best posture. When both feet are on the ground, the low back tends to lose its slight inward curve. If this curve flattens (or becomes too large), then the back and your other joints will experience too much stress, fatigue more quickly, and can cause  pain.   RESTING POSITIONS Consider which positions are most painful for you when choosing a resting position. If you have pain with flexion-based activities (sitting, bending, stooping, squatting), choose a position that allows you to rest in a less flexed posture. You would want to avoid curling into a  fetal position on your side. If your pain worsens with extension-based activities (prolonged standing, working overhead), avoid resting in an extended position such as sleeping on your stomach. Most people will find more comfort when they rest with their spine in a more neutral position, neither too rounded nor too arched. Lying on a non-sagging bed on your side with a pillow between your knees, or on your back with a pillow under your knees will often provide some relief. Keep in mind, being in any one position for a prolonged period of time, no matter how correct your posture, can still lead to stiffness.  WALKING Walk with an upright posture. Your ears, shoulders, and hips should all line up. OFFICE WORK When working at a desk, create an environment that supports good, upright posture. Without extra support, muscles fatigue and lead to excessive strain on joints and other tissues.  CHAIR:  A chair should be able to slide under your desk when your back makes contact with the back of the chair. This allows you to work closely.  The chair's height should allow your eyes to be level with the upper part of your monitor and your hands to be slightly lower than your elbows.  Body position: ? Your feet should make contact with the floor. If this is not possible, use a foot rest. ? Keep your ears over your shoulders. This will reduce stress on your neck and low back.

## 2020-06-29 NOTE — Progress Notes (Signed)
Musculoskeletal Exam  Patient: Michael George DOB: [birth year]  DOS: 06/29/2020  SUBJECTIVE:  Chief Complaint:   Chief Complaint  Patient presents with  . Chest Pain    Going on for one year, left side upper chest pain radiates to neck     Michael George is a 35 y.o.  male for evaluation and treatment of L chest/neck pain.   Onset:  1 year ago. No inj or change in activity.  Location: L upper chest, neck Character:  dull and sharp  Progression of issue:  is unchanged Associated symptoms: No decreased ROM, redness, swelling, bruising Treatment: to date has been: none.   Neurovascular symptoms: no  Past Medical History:  Diagnosis Date  . Back pain 2017   L4-5 transforaminal epidural injection on 07/20/2015  . Compartment syndrome (HCC) 2016  . Depression   . Psoriasis 2014  . Radiculitis 2017   L side, good partial benefit after epidural steriod inj  . Stargardt's disease     Objective: VITAL SIGNS: BP 118/72 (BP Location: Left Arm, Patient Position: Sitting, Cuff Size: Large)   Pulse 97   Resp 17   Ht 5' 11.5" (1.816 m)   Wt 207 lb (93.9 kg)   SpO2 99%   BMI 28.47 kg/m  Constitutional: Well formed, well developed. No acute distress. Thorax & Lungs: No accessory muscle use Musculoskeletal: L chest/neck.   Normal active range of motion: yes.   Normal passive range of motion: yes Tenderness to palpation: mild over L lateral neck and prox pec at mid clav line.  Deformity: no Ecchymosis: no Tests positive: none Tests negative: spurling's Neurologic: Normal sensory function. No focal deficits noted. DTR's equal and symmetric in UE's. No clonus. 5/5 strength throughout UE's Psychiatric: Normal mood. Age appropriate judgment and insight. Alert & oriented x 3.    Assessment:  Neck pain  Chest wall pain  Plan: Stretches/exercises for pec and neck, heat, ice, Tylenol, ibuprofen. Send message in 1 mo if no bettre and will refer to sports med.  F/u prn. The  patient voiced understanding and agreement to the plan.   Jilda Roche [place], DO 06/29/20  11:50 AM

## 2020-09-10 ENCOUNTER — Other Ambulatory Visit: Payer: Self-pay

## 2020-09-10 ENCOUNTER — Ambulatory Visit: Payer: BC Managed Care – PPO | Admitting: Family Medicine

## 2020-09-10 ENCOUNTER — Encounter: Payer: Self-pay | Admitting: Family Medicine

## 2020-09-10 VITALS — BP 114/78 | HR 98 | Temp 98.0°F | Ht 71.0 in | Wt 205.4 lb

## 2020-09-10 DIAGNOSIS — J3489 Other specified disorders of nose and nasal sinuses: Secondary | ICD-10-CM

## 2020-09-10 DIAGNOSIS — J302 Other seasonal allergic rhinitis: Secondary | ICD-10-CM

## 2020-09-10 DIAGNOSIS — K219 Gastro-esophageal reflux disease without esophagitis: Secondary | ICD-10-CM

## 2020-09-10 DIAGNOSIS — R0789 Other chest pain: Secondary | ICD-10-CM | POA: Diagnosis not present

## 2020-09-10 MED ORDER — PANTOPRAZOLE SODIUM 40 MG PO TBEC: 40.0000 mg | DELAYED_RELEASE_TABLET | Freq: Every day | ORAL | 2 refills | Status: DC

## 2020-09-10 MED ORDER — LEVOCETIRIZINE DIHYDROCHLORIDE 5 MG PO TABS: 5.0000 mg | ORAL_TABLET | Freq: Every evening | ORAL | 2 refills | Status: DC

## 2020-09-10 NOTE — Progress Notes (Signed)
Chief Complaint  Patient presents with  . Sinus Problem  . Shoulder Pain    Subjective: Patient is a 35 y.o. male here for L sinus pain.  1 week started having L sinus pain. + runny/stuffy nose, itchy/watery eyes. No fevers, ST, ear pain, cough, wheezing, sob. +allergies. No sick contacts. Did not take anything at home so far.   Patient continues to have upper chest wall pain on the left.  He believes his reflux may be playing a role as well.  He takes omeprazole 40 mg daily.  He was still have breakthrough episodes though.  No bruising, redness, swelling, nausea, vomiting, bleeding, or abdominal pain.  Past Medical History:  Diagnosis Date  . Back pain 2017   L4-5 transforaminal epidural injection on 07/20/2015  . Compartment syndrome (HCC) 2016  . Depression   . Psoriasis 2014  . Radiculitis 2017   L side, good partial benefit after epidural steriod inj  . Stargardt's disease     Objective: BP 114/78 (BP Location: Left Arm, Patient Position: Sitting, Cuff Size: Normal)   Pulse 98   Temp 98 F (36.7 C) (Oral)   Ht 5\' 11"  (1.803 m)   Wt 205 lb 6 oz (93.2 kg)   SpO2 96%   BMI 28.64 kg/m  General: Awake, appears stated age HEENT: MMM, EOMi, ears negative bilaterally, nares are patent without discharge, no tenderness over the sinuses bilaterally Heart: RRR Lungs: CTAB, no rales, wheezes or rhonchi. No accessory muscle use MSK: There is no tenderness to palpation of the left pectoral. Psych: Age appropriate judgment and insight, normal affect and mood  Assessment and Plan: Sinus pain  Chest wall pain  Seasonal allergies - Plan: levocetirizine (XYZAL) 5 MG tablet  Gastroesophageal reflux disease, unspecified whether esophagitis present - Plan: pantoprazole (PROTONIX) 40 MG tablet  1.  Resolved, likely related to #3 2.  Continue home exercise program for the pectorals, offered physical therapy versus sports medicine.  He will think about it. 3.  Start oral antihistamine  on an as-needed basis.  He is allergic to his cat at baseline. 4.  Change omeprazole to Protonix. Follow-up as originally scheduled next month for his physical. The patient voiced understanding and agreement to the plan.  [place], DO 09/10/20  11:58 AM

## 2020-09-10 NOTE — Patient Instructions (Addendum)
Let me know if you would like to see physical therapy or sports med for your hest wall.   Stop taking the omeprazole as we have sent in a new medicine to replace it.   The only lifestyle changes that have data behind them are weight loss for the overweight/obese and elevating the head of the bed. Finding out which foods/positions are triggers is important.  I wouldn't do anything with your sinuses now.   Claritin (loratadine), Allegra (fexofenadine), Zyrtec (cetirizine) which is also equivalent to Xyzal (levocetirizine); these are listed in order from weakest to strongest. Generic, and therefore cheaper, options are in the parentheses.   Flonase (fluticasone); nasal spray that is over the counter. 2 sprays each nostril, once daily. Aim towards the same side eye when you spray.  There are available OTC, and the generic versions, which may be cheaper, are in parentheses. Show this to a pharmacist if you have trouble finding any of these items.  Let us know if you need anything.

## 2020-10-08 ENCOUNTER — Other Ambulatory Visit: Payer: Self-pay | Admitting: Family Medicine

## 2020-10-08 DIAGNOSIS — R0789 Other chest pain: Secondary | ICD-10-CM

## 2020-10-22 ENCOUNTER — Encounter: Payer: Self-pay | Admitting: Family Medicine

## 2020-10-22 ENCOUNTER — Ambulatory Visit (INDEPENDENT_AMBULATORY_CARE_PROVIDER_SITE_OTHER): Payer: BC Managed Care – PPO | Admitting: Family Medicine

## 2020-10-22 ENCOUNTER — Other Ambulatory Visit: Payer: Self-pay

## 2020-10-22 VITALS — BP 122/80 | HR 105 | Temp 98.4°F | Ht 71.0 in | Wt 209.1 lb

## 2020-10-22 DIAGNOSIS — J302 Other seasonal allergic rhinitis: Secondary | ICD-10-CM

## 2020-10-22 DIAGNOSIS — Z23 Encounter for immunization: Secondary | ICD-10-CM | POA: Diagnosis not present

## 2020-10-22 DIAGNOSIS — Z1159 Encounter for screening for other viral diseases: Secondary | ICD-10-CM

## 2020-10-22 DIAGNOSIS — Z0000 Encounter for general adult medical examination without abnormal findings: Secondary | ICD-10-CM

## 2020-10-22 DIAGNOSIS — Z114 Encounter for screening for human immunodeficiency virus [HIV]: Secondary | ICD-10-CM

## 2020-10-22 DIAGNOSIS — K219 Gastro-esophageal reflux disease without esophagitis: Secondary | ICD-10-CM

## 2020-10-22 MED ORDER — PANTOPRAZOLE SODIUM 40 MG PO TBEC: 40.0000 mg | DELAYED_RELEASE_TABLET | Freq: Every day | ORAL | 2 refills | Status: DC

## 2020-10-22 MED ORDER — LEVOCETIRIZINE DIHYDROCHLORIDE 5 MG PO TABS: 5.0000 mg | ORAL_TABLET | Freq: Every evening | ORAL | 2 refills | Status: DC

## 2020-10-22 NOTE — Patient Instructions (Signed)
Give us 2-3 business days to get the results of your labs back.   Keep the diet clean and stay active.  Do monthly self testicular checks in the shower. You are feeling for lumps/bumps that don't belong. If you feel anything like this, let me know!  Let us know if you need anything. 

## 2020-10-22 NOTE — Progress Notes (Signed)
Chief Complaint  Patient presents with  . Annual Exam    Well Male Michael George is here for a complete physical.   His last physical was >1 year ago.  Current diet: in general, a "healthy" diet.   Current exercise: walking, light lifting Weight trend: stable Fatigue out of ordinary? No. Seat belt? Yes.    Health maintenance Tetanus- No HIV- No Hep C- No  Past Medical History:  Diagnosis Date  . Back pain 2017   L4-5 transforaminal epidural injection on 07/20/2015  . Compartment syndrome (HCC) 2016  . Depression   . Psoriasis 2014  . Radiculitis 2017   L side, good partial benefit after epidural steriod inj  . Stargardt's disease      Past Surgical History:  Procedure Laterality Date  . BACK SURGERY    . compartment syndrome surgery     bilateral legs   . HAND SURGERY     with pin placement    Medications  Current Outpatient Medications on File Prior to Visit  Medication Sig Dispense Refill  . Ascorbic Acid (VITAMIN C) 100 MG tablet Take 100 mg by mouth daily.    . cholecalciferol (VITAMIN D3) 25 MCG (1000 UNIT) tablet Take 1,000 Units by mouth as needed.    . finasteride (PROPECIA) 1 MG tablet Take 1 mg by mouth at bedtime.     . gabapentin (NEURONTIN) 300 MG capsule Take 1 capsule (300 mg total) by mouth 3 (three) times daily. 90 capsule 3  . levocetirizine (XYZAL) 5 MG tablet Take 1 tablet (5 mg total) by mouth every evening. 90 tablet 2  . pantoprazole (PROTONIX) 40 MG tablet Take 1 tablet (40 mg total) by mouth daily. 90 tablet 2  . Thiamine HCl (VITAMIN B-1) 250 MG tablet Take 250 mg by mouth daily.    Marland Kitchen zinc gluconate 50 MG tablet Take 50 mg by mouth daily.      Allergies Allergies  Allergen Reactions  . Peanut-Containing Drug Products Nausea And Vomiting    Family History Family History  Problem Relation Age of Onset  . Breast cancer Mother   . Uterine cancer Maternal Grandmother   . Diabetes Paternal Grandfather   . Heart attack Paternal  Grandfather   . Diabetes Maternal Uncle   . Heart disease Neg Hx   . Colon cancer Neg Hx   . Stomach cancer Neg Hx   . Rectal cancer Neg Hx     Review of Systems: Constitutional: no fevers or chills Eye:  no recent significant change in vision Ear/Nose/Mouth/Throat:  Ears:  no hearing loss Nose/Mouth/Throat:  no complaints of nasal congestion, no sore throat Cardiovascular:  no chest pain Respiratory:  no shortness of breath Gastrointestinal:  no abdominal pain, no change in bowel habits GU:  Male: negative for dysuria Musculoskeletal/Extremities:  no pain of the joints Integumentary (Skin/Breast):  no abnormal skin lesions reported Neurologic:  no headaches Endocrine: No unexpected weight changes Hematologic/Lymphatic:  no night sweats  Exam BP 122/80 (BP Location: Left Arm, Patient Position: Sitting, Cuff Size: Normal)   Pulse (!) 105   Temp 98.4 F (36.9 C) (Oral)   Ht 5\' 11"  (1.803 m)   Wt 209 lb 2 oz (94.9 kg)   SpO2 97%   BMI 29.17 kg/m  General:  well developed, well nourished, in no apparent distress Skin:  no significant moles, warts, or growths Head:  no masses, lesions, or tenderness Eyes:  pupils equal and round, sclera anicteric without injection Ears:  canals without lesions, TMs shiny without retraction, no obvious effusion, no erythema Nose:  nares patent, septum midline, mucosa normal Throat/Pharynx:  lips and gingiva without lesion; tongue and uvula midline; non-inflamed pharynx; no exudates or postnasal drainage Neck: neck supple without adenopathy, thyromegaly, or masses Lungs:  clear to auscultation, breath sounds equal bilaterally, no respiratory distress Cardio:  regular rate and rhythm, no bruits, no LE edema Abdomen:  abdomen soft, nontender; bowel sounds normal; no masses or organomegaly Genital (male): Deferred Rectal: Deferred Musculoskeletal:  symmetrical muscle groups noted without atrophy or deformity Extremities:  no clubbing, cyanosis,  or edema, no deformities, no skin discoloration Neuro:  gait normal; deep tendon reflexes normal and symmetric Psych: well oriented with normal range of affect and appropriate judgment/insight  Assessment and Plan  Well adult exam - Plan: CBC, Comprehensive metabolic panel, Lipid panel  Screening for HIV without presence of risk factors - Plan: HIV Antibody (routine testing w rflx)  Encounter for hepatitis C screening test for low risk patient - Plan: Hepatitis C antibody  Gastroesophageal reflux disease, unspecified whether esophagitis present - Plan: pantoprazole (PROTONIX) 40 MG tablet  Seasonal allergies - Plan: levocetirizine (XYZAL) 5 MG tablet   Well 35 y.o. male. Counseled on diet and exercise. Self testicular exams recommended at least monthly.  Other orders as above. Follow up in 6 mo pending the above workup. The patient voiced understanding and agreement to the plan.  Jilda Roche [place], DO 10/22/20 1:23 PM

## 2020-10-22 NOTE — Addendum Note (Signed)
Addended by: Scharlene Gloss B on: 10/22/2020 01:37 PM   Modules accepted: Orders

## 2020-10-23 ENCOUNTER — Ambulatory Visit: Payer: BC Managed Care – PPO | Attending: Family Medicine | Admitting: Physical Therapy

## 2020-10-23 ENCOUNTER — Encounter: Payer: Self-pay | Admitting: Physical Therapy

## 2020-10-23 DIAGNOSIS — M25512 Pain in left shoulder: Secondary | ICD-10-CM | POA: Diagnosis not present

## 2020-10-23 DIAGNOSIS — M62838 Other muscle spasm: Secondary | ICD-10-CM | POA: Diagnosis not present

## 2020-10-23 DIAGNOSIS — M25612 Stiffness of left shoulder, not elsewhere classified: Secondary | ICD-10-CM | POA: Diagnosis not present

## 2020-10-23 DIAGNOSIS — M6281 Muscle weakness (generalized): Secondary | ICD-10-CM | POA: Insufficient documentation

## 2020-10-23 DIAGNOSIS — R293 Abnormal posture: Secondary | ICD-10-CM | POA: Insufficient documentation

## 2020-10-23 LAB — CBC
HCT: 45.6 % (ref 39.0–52.0)
Hemoglobin: 15.7 g/dL (ref 13.0–17.0)
MCHC: 34.3 g/dL (ref 30.0–36.0)
MCV: 90.2 fl (ref 78.0–100.0)
Platelets: 158 10*3/uL (ref 150.0–400.0)
RBC: 5.06 Mil/uL (ref 4.22–5.81)
RDW: 13.2 % (ref 11.5–15.5)
WBC: 5.5 10*3/uL (ref 4.0–10.5)

## 2020-10-23 LAB — LIPID PANEL
Cholesterol: 139 mg/dL (ref 0–200)
HDL: 53.4 mg/dL (ref >39.00)
LDL Cholesterol: 69 mg/dL (ref 0–99)
NonHDL: 85.95
Total CHOL/HDL Ratio: 3
Triglycerides: 86 mg/dL (ref 0.0–149.0)
VLDL: 17.2 mg/dL (ref 0.0–40.0)

## 2020-10-23 LAB — COMPREHENSIVE METABOLIC PANEL
ALT: 43 U/L (ref 0–53)
AST: 35 U/L (ref 0–37)
Albumin: 4.7 g/dL (ref 3.5–5.2)
Alkaline Phosphatase: 54 U/L (ref 39–117)
BUN: 13 mg/dL (ref 6–23)
CO2: 28 mEq/L (ref 19–32)
Calcium: 9.9 mg/dL (ref 8.4–10.5)
Chloride: 104 mEq/L (ref 96–112)
Creatinine, Ser: 1.41 mg/dL (ref 0.40–1.50)
GFR: 64.6 mL/min (ref >60.00)
Glucose, Bld: 99 mg/dL (ref 70–99)
Potassium: 4.4 mEq/L (ref 3.5–5.1)
Sodium: 142 mEq/L (ref 135–145)
Total Bilirubin: 0.9 mg/dL (ref 0.2–1.2)
Total Protein: 6.8 g/dL (ref 6.0–8.3)

## 2020-10-23 LAB — HEPATITIS C ANTIBODY
Hepatitis C Ab: NONREACTIVE
SIGNAL TO CUT-OFF: 0 (ref <1.00)

## 2020-10-23 LAB — HIV ANTIBODY (ROUTINE TESTING W REFLEX): HIV 1&2 Ab, 4th Generation: NONREACTIVE
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
## 2020-10-23 NOTE — Patient Instructions (Signed)
      Access Code: [email or web address] URL: [email or web address] Date: 10/23/2020 Prepared by: Glenetta Hew  Exercises Single Arm Doorway Pec Stretch at 90 Degrees Abduction - 2-3 x daily - 7 x weekly - 3 reps - 30 sec hold Single Arm Doorway Pec Stretch at 120 Degrees Abduction - 2-3 x daily - 7 x weekly - 3 reps - 30 sec hold Seated Gentle Upper Trapezius Stretch - 2-3 x daily - 7 x weekly - 3 reps - 30 sec hold Gentle Levator Scapulae Stretch - 2-3 x daily - 7 x weekly - 3 reps - 30 sec hold Standing Pectoral Release with Ball at [employer] - 1-2 x daily - 7 x weekly - 1-2 min hold Standing Shoulder Row with Anchored Resistance - 1 x daily - 7 x weekly - 2 sets - 10 reps - 5 sec hold Scapular Retraction with Resistance Advanced - 1 x daily - 7 x weekly - 2 sets - 10 reps - 5 sec hold  Patient Education Office Posture Trigger Point Dry Needling  Trigger Point Dry Needling  . What is Trigger Point Dry Needling (DN)? o DN is a physical therapy technique used to treat muscle pain and dysfunction. Specifically, DN helps deactivate muscle trigger points (muscle knots).  o A thin filiform needle is used to penetrate the skin and stimulate the underlying trigger point. The goal is for a local twitch response (LTR) to occur and for the trigger point to relax. No medication of any kind is injected during the procedure.   . What Does Trigger Point Dry Needling Feel Like?  o The procedure feels different for each individual patient. Some patients report that they do not actually feel the needle enter the skin and overall the process is not painful. Very mild bleeding may occur. However, many patients feel a deep cramping in the muscle in which the needle was inserted. This is the local twitch response.   Marland Kitchen How Will I feel after the treatment? o Soreness is normal, and the onset of soreness may not occur for a few hours. Typically this soreness does not last longer than two days.  o Bruising is  uncommon, however; ice can be used to decrease any possible bruising.  o In rare cases feeling tired or nauseous after the treatment is normal. In addition, your symptoms may get worse before they get better, this period will typically not last longer than 24 hours.   . What Can I do After My Treatment? o Increase your hydration by drinking more water for the next 24 hours. o You may place ice or heat on the areas treated that have become sore, however, do not use heat on inflamed or bruised areas. Heat often brings more relief post needling. o You can continue your regular activities, but vigorous activity is not recommended initially after the treatment for 24 hours. o DN is best combined with other physical therapy such as strengthening, stretching, and other therapies.

## 2020-11-01 DIAGNOSIS — K219 Gastro-esophageal reflux disease without esophagitis: Secondary | ICD-10-CM | POA: Diagnosis not present

## 2020-11-01 DIAGNOSIS — Z91018 Allergy to other foods: Secondary | ICD-10-CM | POA: Diagnosis not present

## 2020-11-01 DIAGNOSIS — M791 Myalgia, unspecified site: Secondary | ICD-10-CM | POA: Diagnosis not present

## 2020-11-07 ENCOUNTER — Ambulatory Visit: Payer: BC Managed Care – PPO | Attending: Family Medicine

## 2020-11-07 ENCOUNTER — Other Ambulatory Visit: Payer: Self-pay

## 2020-11-07 DIAGNOSIS — M25612 Stiffness of left shoulder, not elsewhere classified: Secondary | ICD-10-CM | POA: Diagnosis not present

## 2020-11-07 DIAGNOSIS — M6281 Muscle weakness (generalized): Secondary | ICD-10-CM

## 2020-11-07 DIAGNOSIS — M25512 Pain in left shoulder: Secondary | ICD-10-CM

## 2020-11-07 DIAGNOSIS — M62838 Other muscle spasm: Secondary | ICD-10-CM | POA: Diagnosis not present

## 2020-11-07 DIAGNOSIS — R293 Abnormal posture: Secondary | ICD-10-CM | POA: Diagnosis not present

## 2020-11-07 NOTE — Therapy (Addendum)
[place] [address]  [place], [place], [address] Phone: [phone]   Fax:  [phone]  Physical Therapy Treatment / Discharge Summary  Patient Details  Name: Michael George MRN: [id number] Date of Birth: [birth year] Referring Provider (PT): Crosby Oyster [place], [place]   Encounter Date: 11/07/2020   PT End of Session - 11/07/20 1155     Visit Number 2    Date for PT Re-Evaluation 12/18/20    Authorization Type BCBS - VL: 60 (PT/OT/ST combined)    PT Start Time 1104    PT Stop Time 1146    PT Time Calculation (min) 42 min    Activity Tolerance Patient tolerated treatment well    Behavior During Therapy [facility] for tasks assessed/performed             Past Medical History:  Diagnosis Date   Back pain 2017   L4-5 transforaminal epidural injection on 07/20/2015   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   Subjective Assessment - 11/07/20 1107     Subjective Pt reports that he chest has been bothering him still. Pt reports that he threw baseball with his nephew the other day and pain went up to 6/10 in the chest.    Patient Stated Goals "to get rid of the pain"    Currently in Pain? Yes    Pain Score 2     Pain Location Shoulder    Pain Orientation Left    Pain Descriptors / Indicators Sore    Pain Type Acute pain                               [facility] Adult PT Treatment/Exercise - 11/07/20 0001       Exercises   Exercises Shoulder      Shoulder Exercises: Seated   External Rotation Strengthening;Both;10 reps;Theraband    Theraband Level (Shoulder  External Rotation) Level 2 (Red)      Shoulder Exercises: Prone   Retraction Strengthening;Left;10 reps    Retraction Weight (lbs) 2    Horizontal ABduction 1 Strengthening;Left;10 reps;Weights    Horizontal ABduction 1 Weight (lbs) 1    Horizontal ABduction 1 Limitations TCs for post shoulder girdle engagement    Other Prone Exercises L scaption with 1# weight 10 reps      Shoulder Exercises: ROM/Strengthening   UBE (Upper Arm Bike) L1x72mn fwd/310m back      Shoulder Exercises: Stretch   Corner Stretch 2 reps;30 seconds    Corner Stretch Limitations mid & high doorway pec stretches      Manual Therapy   Manual Therapy Soft tissue mobilization;Myofascial release    Soft tissue mobilization L pecs, ant deltoid    Myofascial Release L pecs, anterior deltoid      Neck Exercises: Stretches   Upper Trapezius Stretch Left;30 seconds    Levator Stretch Left;30 seconds  PT Long Term Goals - 11/07/20 1156       PT LONG TERM GOAL #1   Title Patient will be independent with ongoing/advanced HEP +/- gym program for self-management at home    Status On-going      PT LONG TERM GOAL #2   Title Improve posture and alignment with patient to demonstrate improved upright posture with posterior shoulder girdle engaged    Status On-going      PT LONG TERM GOAL #3   Title Decrease L anterior chest wall and shoulder pain by >/= 50-75% allowing patient increased ease of L UE use    Status On-going      PT LONG TERM GOAL #4   Title Patient to improve L shoulder & thoracic AROM to WNL without pain provocation    Status On-going      PT LONG TERM GOAL #5   Title Patient will demonstrate improved L shoulder strength to >/= 4+/5 for functional UE use    Status On-going      PT LONG TERM GOAL #6   Title Patient to report ability to perform ADLs, household, and work-related tasks without limitation due to L chest or shoulder pain, LOM or weakness    Status  On-going                   Plan - 11/07/20 1157     Clinical Impression Statement Pt reports throwing baseball with his nephew the other day and chest pain shot up to 6/10 during a swing. He shows much soft tissue restriction in the L pecs during MT, along with TTP. TCs needed to facilitate posterior shoulder girdle engagement during exercises. He notes relief from doorway pec stretches and upper neck stetches. He noted that he is interested in trying DN to address the tightness in his L pectoral muscles next session, overall pt responded well.    Personal Factors and Comorbidities Time since onset of injury/illness/exacerbation;Past/Current Experience;Comorbidity 3+    Comorbidities LBP with 2 level discectomies; B LE compartment syndrome surgery; Stargardt's disease; macular degeneration; depression; psoriasis    PT Frequency 1x / week    PT Duration 6 weeks    PT Treatment/Interventions ADLs/Self Care Home Management;Cryotherapy;Electrical Stimulation;Iontophoresis 4mg/ml Dexamethasone;Moist Heat;Ultrasound;Therapeutic activities;Therapeutic exercise;Neuromuscular re-education;Patient/family education;Manual techniques;Passive range of motion;Dry needling;Taping;Vasopneumatic Device;Joint Manipulations    PT Next Visit Plan Review initial HEP & udate as indicated; Manual therapy to address increased muscle tension; L shoulder stretching; Postural and L RTC strengthening; Modailities including possible ionto PRN    PT Home Exercise Plan Access Code: [email or web address] ([email or web address])    Consulted and Agree with Plan of Care Patient             Patient will benefit from skilled therapeutic intervention in order to improve the following deficits and impairments:  Pain,Postural dysfunction,Improper body mechanics,Impaired UE functional use,Impaired flexibility,Increased muscle spasms,Increased fascial restricitons,Decreased strength,Decreased range of motion,Decreased activity tolerance  Visit  Diagnosis: Acute pain of left shoulder  Stiffness of left shoulder, not elsewhere classified  Other muscle spasm  Muscle weakness (generalized)  Abnormal posture     Problem List Patient Active Problem List   Diagnosis Date Noted   Psoriasis 09/17/2012   Chest wall pain 11/04/2011   MACULAR DEGENERATION 03/04/2007    Braylin L Clark, PTA 11/07/2020, 12:08 PM  [facility] [place] [address]  [address] [place], [place], [address] Phone: [phone]   Fax:  [phone]  Name: Michael George   MRN: [id number] Date of Birth: 01/01/1986    PHYSICAL THERAPY DISCHARGE SUMMARY  Visits from Start of Care: 2  Current functional level related to goals / functional outcomes:   Refer to above clinical impression for status as of last visit on 11/07/2020. Patient called to cancel all remaining appointments without giving a reason and has not returned to PT in >30 days, therefore will proceed with discharge from PT for this episode.   Remaining deficits:   As above. Unable to formally assess status at D/C due to pt failure to return to PT.   Education / Equipment:   Initial HEP   Patient agrees to discharge. Patient goals were not met. Patient is being discharged due to the patient's request.  JoAnne M. Kreis, PT, MPT 01/17/21, 4:03 PM  [facility] [place] [address]  [address] [place], [place], [address] Phone: [phone]   Fax:  [phone]   

## 2020-11-14 ENCOUNTER — Encounter: Payer: BC Managed Care – PPO | Admitting: Physical Therapy

## 2020-11-19 DIAGNOSIS — M542 Cervicalgia: Secondary | ICD-10-CM | POA: Diagnosis not present

## 2020-11-19 DIAGNOSIS — M5417 Radiculopathy, lumbosacral region: Secondary | ICD-10-CM | POA: Diagnosis not present

## 2020-11-19 DIAGNOSIS — M62830 Muscle spasm of back: Secondary | ICD-10-CM | POA: Diagnosis not present

## 2020-11-28 ENCOUNTER — Encounter: Payer: BC Managed Care – PPO | Admitting: Physical Therapy

## 2020-12-26 DIAGNOSIS — L648 Other androgenic alopecia: Secondary | ICD-10-CM | POA: Diagnosis not present

## 2020-12-26 DIAGNOSIS — L281 Prurigo nodularis: Secondary | ICD-10-CM | POA: Diagnosis not present

## 2021-04-24 ENCOUNTER — Ambulatory Visit: Payer: BC Managed Care – PPO | Admitting: Family Medicine

## 2021-04-30 ENCOUNTER — Ambulatory Visit: Payer: BC Managed Care – PPO | Admitting: Family Medicine

## 2021-04-30 ENCOUNTER — Other Ambulatory Visit: Payer: Self-pay

## 2021-04-30 ENCOUNTER — Encounter: Payer: Self-pay | Admitting: Family Medicine

## 2021-04-30 VITALS — BP 120/78 | HR 81 | Temp 97.4°F | Ht 71.0 in | Wt 223.5 lb

## 2021-04-30 DIAGNOSIS — K219 Gastro-esophageal reflux disease without esophagitis: Secondary | ICD-10-CM | POA: Diagnosis not present

## 2021-04-30 DIAGNOSIS — J302 Other seasonal allergic rhinitis: Secondary | ICD-10-CM | POA: Diagnosis not present

## 2021-04-30 DIAGNOSIS — S99921A Unspecified injury of right foot, initial encounter: Secondary | ICD-10-CM

## 2021-04-30 NOTE — Progress Notes (Signed)
Chief Complaint  Patient presents with   Follow-up    6 month Splinter in right heel     GERD Michael George is a 35 y.o. male who presents for follow up of GERD. Takes Protonix 40 mg/d.  He denies fullness after meals, belching and eructation, abdominal bloating, heartburn, bilious reflux, nocturnal burning, waterbrash, upper abdominal discomfort Aggravating factors and specific triggers include  onions, laying down right after eating, acidic foods . Alleviating factors include antacids  Allergies: overall controlled on Xyzal 5 mg/d.   2 d ago stepped on something with his R heel. Unsure if there a splinter. No itching, redness, drainage, fevers. +pain. Has been using TAO.   Past Medical History:  Diagnosis Date   Back pain 2017   L4-5 transforaminal epidural injection on 07/20/2015   Compartment syndrome ([facility]) 2016   Depression    Psoriasis 2014   Radiculitis 2017   L side, good partial benefit after epidural steriod inj   Stargardt's disease     Exam BP 120/78   Pulse 81   Temp (!) 97.4 F (36.3 C) (Oral)   Ht 5\' 11"  (1.803 m)   Wt 223 lb 8 oz (101.4 kg)   SpO2 98%   BMI 31.17 kg/m  General:  well developed, well nourished, in no apparent distress Skin: Break in skin over R heel without FB noted. No erythema, drainage, fluctuance. +TTP.  Lungs:  clear to auscultation, breath sounds equal bilaterally, no respiratory distress, no wheezes Cardio:  regular rate and rhythm without murmurs, heart sounds without clicks or rubs Abdomen:  abdomen soft, nontender; bowel sounds normal; no masses or organomegaly Psych: Normal affect and mood  Assessment and Plan  Gastroesophageal reflux disease, unspecified whether esophagitis present  Seasonal allergies  Injury of right heel, initial encounter  Chronic, stable. Cont Protonix prn. Counseled on GERD diet and precautions. Cont Xyzal daily. INCS if ss's flare.  Reassurance for skin. I don't see any FB.  Follow up in 6  mo or prn. Pt voiced understanding and agreement to the plan.  Clio, DO 04/30/21  9:06 AM

## 2021-04-30 NOTE — Patient Instructions (Signed)
Consider adding Flonase if your allergies are flaring.  The only lifestyle changes that have data behind them are weight loss for the overweight/obese and elevating the head of the bed. Finding out which foods/positions are triggers is important.  When you do wash it, use only soap and water. Do not vigorously scrub. Apply triple antibiotic ointment (like Neosporin) twice daily. Keep the area clean and dry.   Things to look out for: increasing pain not relieved by ibuprofen/acetaminophen, fevers, spreading redness, drainage of pus, or foul odor.  Let us know if you need anything.

## 2021-05-02 DIAGNOSIS — F419 Anxiety disorder, unspecified: Secondary | ICD-10-CM | POA: Diagnosis not present

## 2021-05-02 DIAGNOSIS — M791 Myalgia, unspecified site: Secondary | ICD-10-CM | POA: Diagnosis not present

## 2021-05-02 DIAGNOSIS — K219 Gastro-esophageal reflux disease without esophagitis: Secondary | ICD-10-CM | POA: Diagnosis not present

## 2021-05-02 DIAGNOSIS — Z91018 Allergy to other foods: Secondary | ICD-10-CM | POA: Diagnosis not present

## 2021-07-15 DIAGNOSIS — M5417 Radiculopathy, lumbosacral region: Secondary | ICD-10-CM | POA: Diagnosis not present

## 2021-07-15 DIAGNOSIS — M62830 Muscle spasm of back: Secondary | ICD-10-CM | POA: Diagnosis not present

## 2021-07-15 DIAGNOSIS — M542 Cervicalgia: Secondary | ICD-10-CM | POA: Diagnosis not present

## 2021-07-19 DIAGNOSIS — M542 Cervicalgia: Secondary | ICD-10-CM | POA: Diagnosis not present

## 2021-07-19 DIAGNOSIS — M5417 Radiculopathy, lumbosacral region: Secondary | ICD-10-CM | POA: Diagnosis not present

## 2021-07-19 DIAGNOSIS — M62830 Muscle spasm of back: Secondary | ICD-10-CM | POA: Diagnosis not present

## 2021-07-25 DIAGNOSIS — M62830 Muscle spasm of back: Secondary | ICD-10-CM | POA: Diagnosis not present

## 2021-07-25 DIAGNOSIS — M5417 Radiculopathy, lumbosacral region: Secondary | ICD-10-CM | POA: Diagnosis not present

## 2021-07-25 DIAGNOSIS — M542 Cervicalgia: Secondary | ICD-10-CM | POA: Diagnosis not present

## 2021-08-01 DIAGNOSIS — M542 Cervicalgia: Secondary | ICD-10-CM | POA: Diagnosis not present

## 2021-08-01 DIAGNOSIS — M62830 Muscle spasm of back: Secondary | ICD-10-CM | POA: Diagnosis not present

## 2021-08-01 DIAGNOSIS — M5417 Radiculopathy, lumbosacral region: Secondary | ICD-10-CM | POA: Diagnosis not present

## 2021-08-08 DIAGNOSIS — M62830 Muscle spasm of back: Secondary | ICD-10-CM | POA: Diagnosis not present

## 2021-08-08 DIAGNOSIS — M542 Cervicalgia: Secondary | ICD-10-CM | POA: Diagnosis not present

## 2021-08-08 DIAGNOSIS — M5417 Radiculopathy, lumbosacral region: Secondary | ICD-10-CM | POA: Diagnosis not present

## 2021-08-19 DIAGNOSIS — M542 Cervicalgia: Secondary | ICD-10-CM | POA: Diagnosis not present

## 2021-08-19 DIAGNOSIS — M5417 Radiculopathy, lumbosacral region: Secondary | ICD-10-CM | POA: Diagnosis not present

## 2021-08-19 DIAGNOSIS — M62830 Muscle spasm of back: Secondary | ICD-10-CM | POA: Diagnosis not present

## 2021-08-30 DIAGNOSIS — M542 Cervicalgia: Secondary | ICD-10-CM | POA: Diagnosis not present

## 2021-08-30 DIAGNOSIS — M5417 Radiculopathy, lumbosacral region: Secondary | ICD-10-CM | POA: Diagnosis not present

## 2021-08-30 DIAGNOSIS — M62830 Muscle spasm of back: Secondary | ICD-10-CM | POA: Diagnosis not present

## 2021-09-03 DIAGNOSIS — D485 Neoplasm of uncertain behavior of skin: Secondary | ICD-10-CM | POA: Diagnosis not present

## 2021-09-03 DIAGNOSIS — D692 Other nonthrombocytopenic purpura: Secondary | ICD-10-CM | POA: Diagnosis not present

## 2021-09-03 DIAGNOSIS — L648 Other androgenic alopecia: Secondary | ICD-10-CM | POA: Diagnosis not present

## 2021-09-03 DIAGNOSIS — D1801 Hemangioma of skin and subcutaneous tissue: Secondary | ICD-10-CM | POA: Diagnosis not present

## 2021-09-04 DIAGNOSIS — M542 Cervicalgia: Secondary | ICD-10-CM | POA: Diagnosis not present

## 2021-09-04 DIAGNOSIS — M62830 Muscle spasm of back: Secondary | ICD-10-CM | POA: Diagnosis not present

## 2021-09-04 DIAGNOSIS — M5417 Radiculopathy, lumbosacral region: Secondary | ICD-10-CM | POA: Diagnosis not present

## 2021-09-18 DIAGNOSIS — M542 Cervicalgia: Secondary | ICD-10-CM | POA: Diagnosis not present

## 2021-09-18 DIAGNOSIS — M5417 Radiculopathy, lumbosacral region: Secondary | ICD-10-CM | POA: Diagnosis not present

## 2021-09-18 DIAGNOSIS — M62830 Muscle spasm of back: Secondary | ICD-10-CM | POA: Diagnosis not present

## 2021-09-27 DIAGNOSIS — M542 Cervicalgia: Secondary | ICD-10-CM | POA: Diagnosis not present

## 2021-09-27 DIAGNOSIS — M62830 Muscle spasm of back: Secondary | ICD-10-CM | POA: Diagnosis not present

## 2021-09-27 DIAGNOSIS — M5417 Radiculopathy, lumbosacral region: Secondary | ICD-10-CM | POA: Diagnosis not present

## 2021-10-17 DIAGNOSIS — M542 Cervicalgia: Secondary | ICD-10-CM | POA: Diagnosis not present

## 2021-10-17 DIAGNOSIS — M5417 Radiculopathy, lumbosacral region: Secondary | ICD-10-CM | POA: Diagnosis not present

## 2021-10-17 DIAGNOSIS — M62830 Muscle spasm of back: Secondary | ICD-10-CM | POA: Diagnosis not present

## 2021-10-29 ENCOUNTER — Ambulatory Visit (INDEPENDENT_AMBULATORY_CARE_PROVIDER_SITE_OTHER): Payer: BC Managed Care – PPO | Admitting: Family Medicine

## 2021-10-29 ENCOUNTER — Encounter: Payer: Self-pay | Admitting: Family Medicine

## 2021-10-29 VITALS — BP 108/79 | HR 85 | Temp 97.8°F | Ht 71.0 in | Wt 216.4 lb

## 2021-10-29 DIAGNOSIS — G8929 Other chronic pain: Secondary | ICD-10-CM | POA: Diagnosis not present

## 2021-10-29 DIAGNOSIS — Z0000 Encounter for general adult medical examination without abnormal findings: Secondary | ICD-10-CM | POA: Diagnosis not present

## 2021-10-29 DIAGNOSIS — M25512 Pain in left shoulder: Secondary | ICD-10-CM

## 2021-10-29 LAB — COMPREHENSIVE METABOLIC PANEL
ALT: 31 U/L (ref 0–53)
AST: 22 U/L (ref 0–37)
Albumin: 4.7 g/dL (ref 3.5–5.2)
Alkaline Phosphatase: 64 U/L (ref 39–117)
BUN: 15 mg/dL (ref 6–23)
CO2: 28 mEq/L (ref 19–32)
Calcium: 10.3 mg/dL (ref 8.4–10.5)
Chloride: 103 mEq/L (ref 96–112)
Creatinine, Ser: 1.38 mg/dL (ref 0.40–1.50)
GFR: 65.82 mL/min (ref >60.00)
Glucose, Bld: 96 mg/dL (ref 70–99)
Potassium: 4.1 mEq/L (ref 3.5–5.1)
Sodium: 141 mEq/L (ref 135–145)
Total Bilirubin: 0.8 mg/dL (ref 0.2–1.2)
Total Protein: 6.8 g/dL (ref 6.0–8.3)

## 2021-10-29 LAB — CBC
HCT: 46.6 % (ref 39.0–52.0)
Hemoglobin: 16.2 g/dL (ref 13.0–17.0)
MCHC: 34.7 g/dL (ref 30.0–36.0)
MCV: 91.4 fl (ref 78.0–100.0)
Platelets: 173 10*3/uL (ref 150.0–400.0)
RBC: 5.09 Mil/uL (ref 4.22–5.81)
RDW: 13.2 % (ref 11.5–15.5)
WBC: 6.2 10*3/uL (ref 4.0–10.5)

## 2021-10-29 LAB — LIPID PANEL
Cholesterol: 136 mg/dL (ref 0–200)
HDL: 42.9 mg/dL (ref >39.00)
LDL Cholesterol: 71 mg/dL (ref 0–99)
NonHDL: 92.88
Total CHOL/HDL Ratio: 3
Triglycerides: 108 mg/dL (ref 0.0–149.0)
VLDL: 21.6 mg/dL (ref 0.0–40.0)

## 2021-10-29 NOTE — Progress Notes (Signed)
Chief Complaint  Patient presents with   Annual Exam    Left shoulder pain     Well Male Michael George is here for a complete physical.   His last physical was >1 year ago.  Current diet: in general, diet could be better.   Current exercise: lifting weights, basketball, hits the bag, walking Weight trend: stable Fatigue out of ordinary? No. Seat belt? Yes.   Advanced directive? No  Health maintenance Tetanus- Yes HIV- Yes Hep C- Yes  Chronic left shoulder pain Patient continues to have shoulder pain anteriorly and posteriorly on the left.  It is steadily improving but only around 50% over the past year.  He saw physical therapy twice and then stopped going.  He has been compliant with his home exercise program.  He will sometimes have tingling in his hand.  No weakness or decreased range of motion.  He denies any recent injury or change in activity.  Past Medical History:  Diagnosis Date   Back pain 2017   L4-5 transforaminal epidural injection on 07/20/2015   Compartment syndrome ([facility]) 2016   Depression    Psoriasis 2014   Radiculitis 2017   L side, good partial benefit after epidural steriod inj   Stargardt's disease      Past Surgical History:  Procedure Laterality Date   BACK SURGERY     compartment syndrome surgery     bilateral legs    HAND SURGERY     with pin placement    Medications  Current Outpatient Medications on File Prior to Visit  Medication Sig Dispense Refill   finasteride (PROPECIA) 1 MG tablet Take 1 mg by mouth at bedtime.      Allergies Allergies  Allergen Reactions   Peanut-Containing Drug Products Nausea And Vomiting    Family History Family History  Problem Relation Age of Onset   Breast cancer Mother    Uterine cancer Maternal Grandmother    Diabetes Paternal Grandfather    Heart attack Paternal Grandfather    Diabetes Maternal Uncle    Heart disease Neg Hx    Colon cancer Neg Hx    Stomach cancer Neg Hx    Rectal cancer  Neg Hx     Review of Systems: Constitutional: no fevers or chills Eye:  no recent significant change in vision Ear/Nose/Mouth/Throat:  Ears:  no hearing loss Nose/Mouth/Throat:  no complaints of nasal congestion, no sore throat Cardiovascular:  no chest pain Respiratory:  no shortness of breath Gastrointestinal:  no abdominal pain, no change in bowel habits GU:  Male: negative for dysuria Musculoskeletal/Extremities:  + Left shoulder pain Integumentary (Skin/Breast):  no abnormal skin lesions reported Neurologic:  no headaches Endocrine: No unexpected weight changes Hematologic/Lymphatic:  no night sweats  Exam BP 108/79   Pulse 85   Temp 97.8 F (36.6 C) (Oral)   Ht 5\' 11"  (1.803 m)   Wt 216 lb 6 oz (98.1 kg)   SpO2 94%   BMI 30.18 kg/m  General:  well developed, well nourished, in no apparent distress Skin:  no significant moles, warts, or growths Head:  no masses, lesions, or tenderness Eyes:  pupils equal and round, sclera anicteric without injection Ears:  canals without lesions, TMs shiny without retraction, no obvious effusion, no erythema Nose:  nares patent, septum midline, mucosa normal Throat/Pharynx:  lips and gingiva without lesion; tongue and uvula midline; non-inflamed pharynx; no exudates or postnasal drainage Neck: neck supple without adenopathy, thyromegaly, or masses Lungs:  clear  to auscultation, breath sounds equal bilaterally, no respiratory distress Cardio:  regular rate and rhythm, no bruits, no LE edema Abdomen:  abdomen soft, nontender; bowel sounds normal; no masses or organomegaly Genital (male): Deferred Rectal: Deferred Musculoskeletal:  symmetrical muscle groups noted without atrophy or deformity Left shoulder: Normal active and passive range of motion, negative Neer's, Hawkins, empty can, crossover, liftoff, speeds, O'Brien's; mild TTP over the trapezius on the left and proximal insertion of the pec on L Extremities:  no clubbing, cyanosis,  or edema, no deformities, no skin discoloration Neuro:  gait normal; deep tendon reflexes normal and symmetric Psych: well oriented with normal range of affect and appropriate judgment/insight  Assessment and Plan  Well adult exam - Plan: CBC, Comprehensive metabolic panel, Lipid panel  Chronic left shoulder pain - Plan: Ambulatory referral to Sports Medicine   Well 36 y.o. male. Counseled on diet and exercise. Self testicular exams recommended at least monthly.  Advanced directive form provided today.  Other orders as above. Left shoulder pain: Continue home exercise program.  Ice, Tylenol, heat, refer to sports medicine for further evaluation. Follow up in 1 year pending the above workup. The patient voiced understanding and agreement to the plan.  Jilda Roche Clio, DO 10/29/21 9:39 AM

## 2021-10-29 NOTE — Patient Instructions (Addendum)
Give [place] 2-3 business days to get the results of your labs back.   Keep the diet clean and stay active.  Do monthly self testicular checks in the shower. You are feeling for lumps/bumps that don't belong. If you feel anything like this, let me know!  Please get me a copy of your advanced directive form at your convenience.   If you do not hear anything about your referral in the next 1-2 weeks, call our office and ask for an update.  Foods that may reduce pain: 1) Ginger 2) Blueberries 3) Salmon 4) Pumpkin seeds 5) dark chocolate 6) turmeric 7) tart cherries 8) virgin olive oil 9) chilli peppers 10) mint 11) krill oil   Let us know if you need anything.

## 2021-11-06 DIAGNOSIS — M62830 Muscle spasm of back: Secondary | ICD-10-CM | POA: Diagnosis not present

## 2021-11-06 DIAGNOSIS — M542 Cervicalgia: Secondary | ICD-10-CM | POA: Diagnosis not present

## 2021-11-06 DIAGNOSIS — M5417 Radiculopathy, lumbosacral region: Secondary | ICD-10-CM | POA: Diagnosis not present

## 2021-11-11 ENCOUNTER — Ambulatory Visit: Payer: BC Managed Care – PPO | Admitting: Family Medicine

## 2021-11-11 VITALS — BP 108/62 | Ht 71.0 in | Wt 212.0 lb

## 2021-11-11 DIAGNOSIS — M5412 Radiculopathy, cervical region: Secondary | ICD-10-CM | POA: Diagnosis not present

## 2021-11-11 MED ORDER — GABAPENTIN 300 MG PO CAPS: 300.0000 mg | ORAL_CAPSULE | Freq: Three times a day (TID) | ORAL | 1 refills | Status: AC

## 2021-11-11 NOTE — Assessment & Plan Note (Signed)
Acute on chronic in nature.  Symptoms most consistent with a radicular origin given the pattern.  Has gotten improvement recently but symptoms have been present for over a year.  He has tried physical therapy in the past. -Counseled on home exercise therapy and supportive care. -Gabapentin. -Could consider further imaging

## 2021-11-11 NOTE — Progress Notes (Signed)
  [name] - 36 y.o. male MRN [id number]  Date of birth: 12-Jul-1985  SUBJECTIVE:  Including CC & ROS.  No chief complaint on file.   Michael George is a 36 y.o. male that is presenting with acute on chronic chest, back and left arm pain.  The symptoms been getting better over the past few weeks.  He notices pain in these areas and with pain down to his thumb.  Denies any specific inciting event.  No history of surgery.  Has tried physical therapy.  Reviewed the office note from 5/30 shows he has tried physical therapy in the past.   Review of Systems See HPI   HISTORY: Past Medical, Surgical, Social, and Family History Reviewed & Updated per EMR.   Pertinent Historical Findings include:  Past Medical History:  Diagnosis Date   Back pain 2017   L4-5 transforaminal epidural injection on 07/20/2015   Compartment syndrome ([facility]) 2016   Depression    Psoriasis 2014   Radiculitis 2017   L side, good partial benefit after epidural steriod inj   Stargardt's disease     Past Surgical History:  Procedure Laterality Date   BACK SURGERY     compartment syndrome surgery     bilateral legs    HAND SURGERY     with pin placement     PHYSICAL EXAM:  VS: BP 108/62   Ht 5\' 11"  (1.803 m)   Wt 212 lb (96.2 kg)   BMI 29.57 kg/m  Physical Exam Gen: NAD, alert, cooperative with exam, well-appearing MSK:  Neurovascularly intact       ASSESSMENT & PLAN:   Cervical radiculopathy Acute on chronic in nature.  Symptoms most consistent with a radicular origin given the pattern.  Has gotten improvement recently but symptoms have been present for over a year.  He has tried physical therapy in the past. -Counseled on home exercise therapy and supportive care. -Gabapentin. -Could consider further imaging

## 2021-11-11 NOTE — Patient Instructions (Signed)
Nice to meet you Please try heat  Please try the exercises  Please start with 1 pill of gabapentin at night.  You can increase it to 2 or 3 times daily as you tolerate. Please send me a message in MyChart with any questions or updates.  Please see me back in 4 to 6 weeks.   --Dr. Raeford Razor

## 2021-12-17 ENCOUNTER — Ambulatory Visit (HOSPITAL_BASED_OUTPATIENT_CLINIC_OR_DEPARTMENT_OTHER)
Admission: RE | Admit: 2021-12-17 | Discharge: 2021-12-17 | Disposition: A | Discharge status: Home / Self Care | Payer: BC Managed Care – PPO | Source: Ambulatory Visit | Attending: Family Medicine | Admitting: Family Medicine

## 2021-12-17 ENCOUNTER — Encounter: Payer: Self-pay | Admitting: Family Medicine

## 2021-12-17 ENCOUNTER — Ambulatory Visit: Payer: BC Managed Care – PPO | Admitting: Family Medicine

## 2021-12-17 VITALS — BP 104/80 | Ht 71.0 in | Wt 212.0 lb

## 2021-12-17 DIAGNOSIS — M5412 Radiculopathy, cervical region: Secondary | ICD-10-CM | POA: Diagnosis not present

## 2021-12-17 DIAGNOSIS — M542 Cervicalgia: Secondary | ICD-10-CM | POA: Diagnosis not present

## 2021-12-17 NOTE — Progress Notes (Signed)
  JUANYA VILLAVICENCIO - 36 y.o. male MRN [id number]  Date of birth: 09-09-1985  SUBJECTIVE:  Including CC & ROS.  No chief complaint on file.   ZYREN SEVIGNY is a 36 y.o. male that is following up for his radicular pain.  He does not feel as much of the sensation in his left arm.  He does have the left chest changes.  The gabapentin has helped.   Review of Systems See HPI   HISTORY: Past Medical, Surgical, Social, and Family History Reviewed & Updated per EMR.   Pertinent Historical Findings include:  Past Medical History:  Diagnosis Date   Back pain 2017   L4-5 transforaminal epidural injection on 07/20/2015   Compartment syndrome ([facility]) 2016   Depression    Psoriasis 2014   Radiculitis 2017   L side, good partial benefit after epidural steriod inj   Stargardt's disease     Past Surgical History:  Procedure Laterality Date   BACK SURGERY     compartment syndrome surgery     bilateral legs    HAND SURGERY     with pin placement     PHYSICAL EXAM:  VS: BP 104/80 (BP Location: Right Arm, Patient Position: Sitting)   Ht 5\' 11"  (1.803 m)   Wt 212 lb (96.2 kg)   BMI 29.57 kg/m  Physical Exam Gen: NAD, alert, cooperative with exam, well-appearing MSK:  Neurovascularly intact       ASSESSMENT & PLAN:   Cervical radiculopathy Getting improvement.  Still notices some changes around his chest. -Counseled on home exercise therapy and supportive care. -Continue gabapentin.  Counseled on weaning off. -X-ray. -Could consider further imaging.

## 2021-12-17 NOTE — Patient Instructions (Signed)
Good to see you Please continue the gabapentin and you can wean off if you wish I will call with the xray results.   Please send me a message in MyChart with any questions or updates.  Please see me back in 4-6 weeks or as needed if better.   --Dr. Jordan Likes

## 2021-12-17 NOTE — Assessment & Plan Note (Signed)
Getting improvement.  Still notices some changes around his chest. -Counseled on home exercise therapy and supportive care. -Continue gabapentin.  Counseled on weaning off. -X-ray. -Could consider further imaging.

## 2021-12-18 ENCOUNTER — Telehealth: Payer: Self-pay | Admitting: Family Medicine

## 2021-12-18 DIAGNOSIS — M5417 Radiculopathy, lumbosacral region: Secondary | ICD-10-CM | POA: Diagnosis not present

## 2021-12-18 DIAGNOSIS — M62830 Muscle spasm of back: Secondary | ICD-10-CM | POA: Diagnosis not present

## 2021-12-18 DIAGNOSIS — M25512 Pain in left shoulder: Secondary | ICD-10-CM | POA: Diagnosis not present

## 2021-12-18 DIAGNOSIS — M542 Cervicalgia: Secondary | ICD-10-CM | POA: Diagnosis not present

## 2021-12-18 NOTE — Telephone Encounter (Signed)
Left VM for patient. If he calls back please have him speak with a nurse/CMA and inform that his xrays are normal.   If any questions then please take the best time and phone number to call and I will try to call him back.   Myra Rude, MD [facility] 12/18/2021, 9:03 AM

## 2022-02-05 DIAGNOSIS — M62830 Muscle spasm of back: Secondary | ICD-10-CM | POA: Diagnosis not present

## 2022-02-05 DIAGNOSIS — M5417 Radiculopathy, lumbosacral region: Secondary | ICD-10-CM | POA: Diagnosis not present

## 2022-02-05 DIAGNOSIS — M542 Cervicalgia: Secondary | ICD-10-CM | POA: Diagnosis not present

## 2022-02-05 DIAGNOSIS — M25512 Pain in left shoulder: Secondary | ICD-10-CM | POA: Diagnosis not present

## 2022-02-09 IMAGING — DX DG CHEST 2V
2 series · 2 of 2 positions shown · non-contrast
Comparison: None.

CLINICAL DATA: Intermittent shortness of breath

EXAM:
CHEST - 2 VIEW

[chest pa]
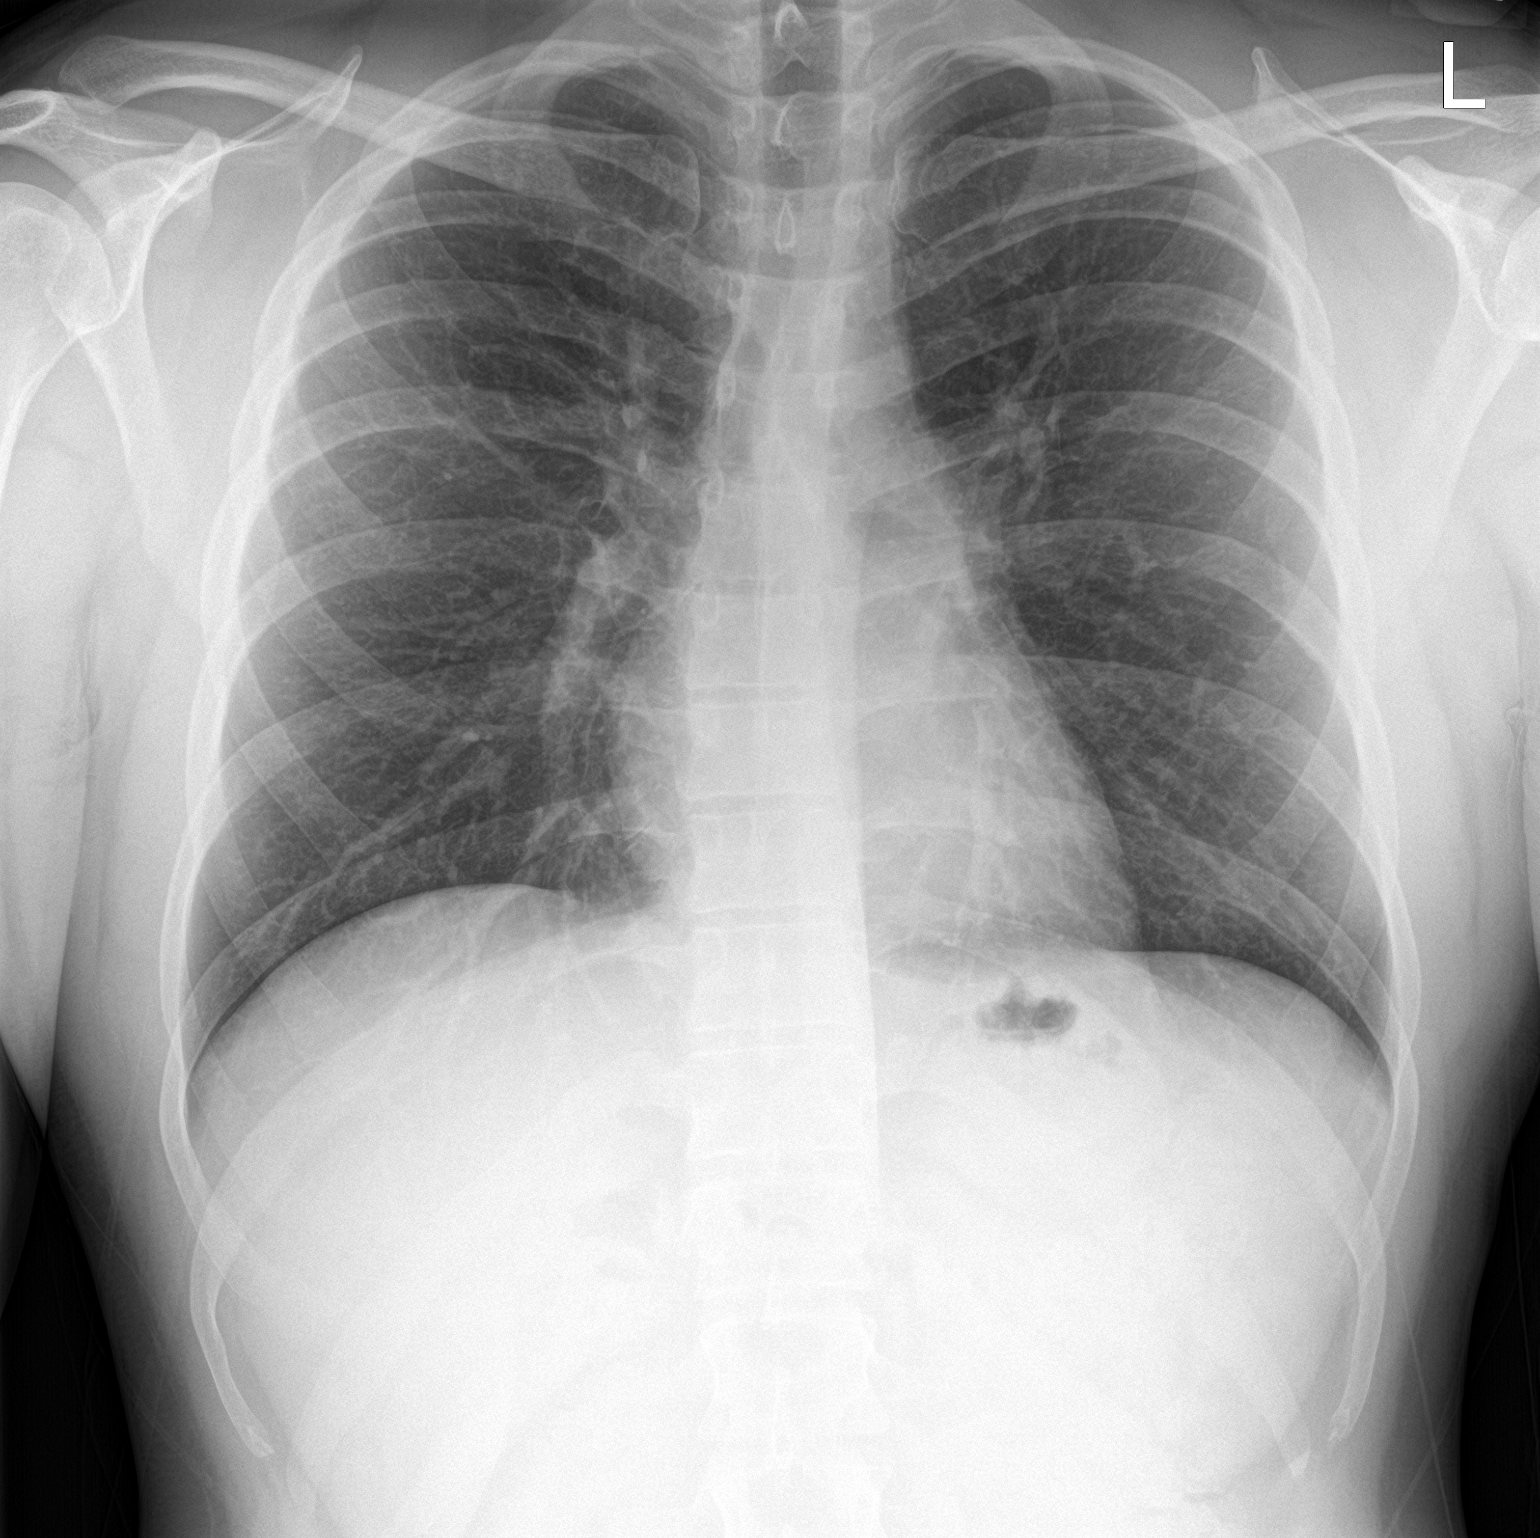

[chest lat]
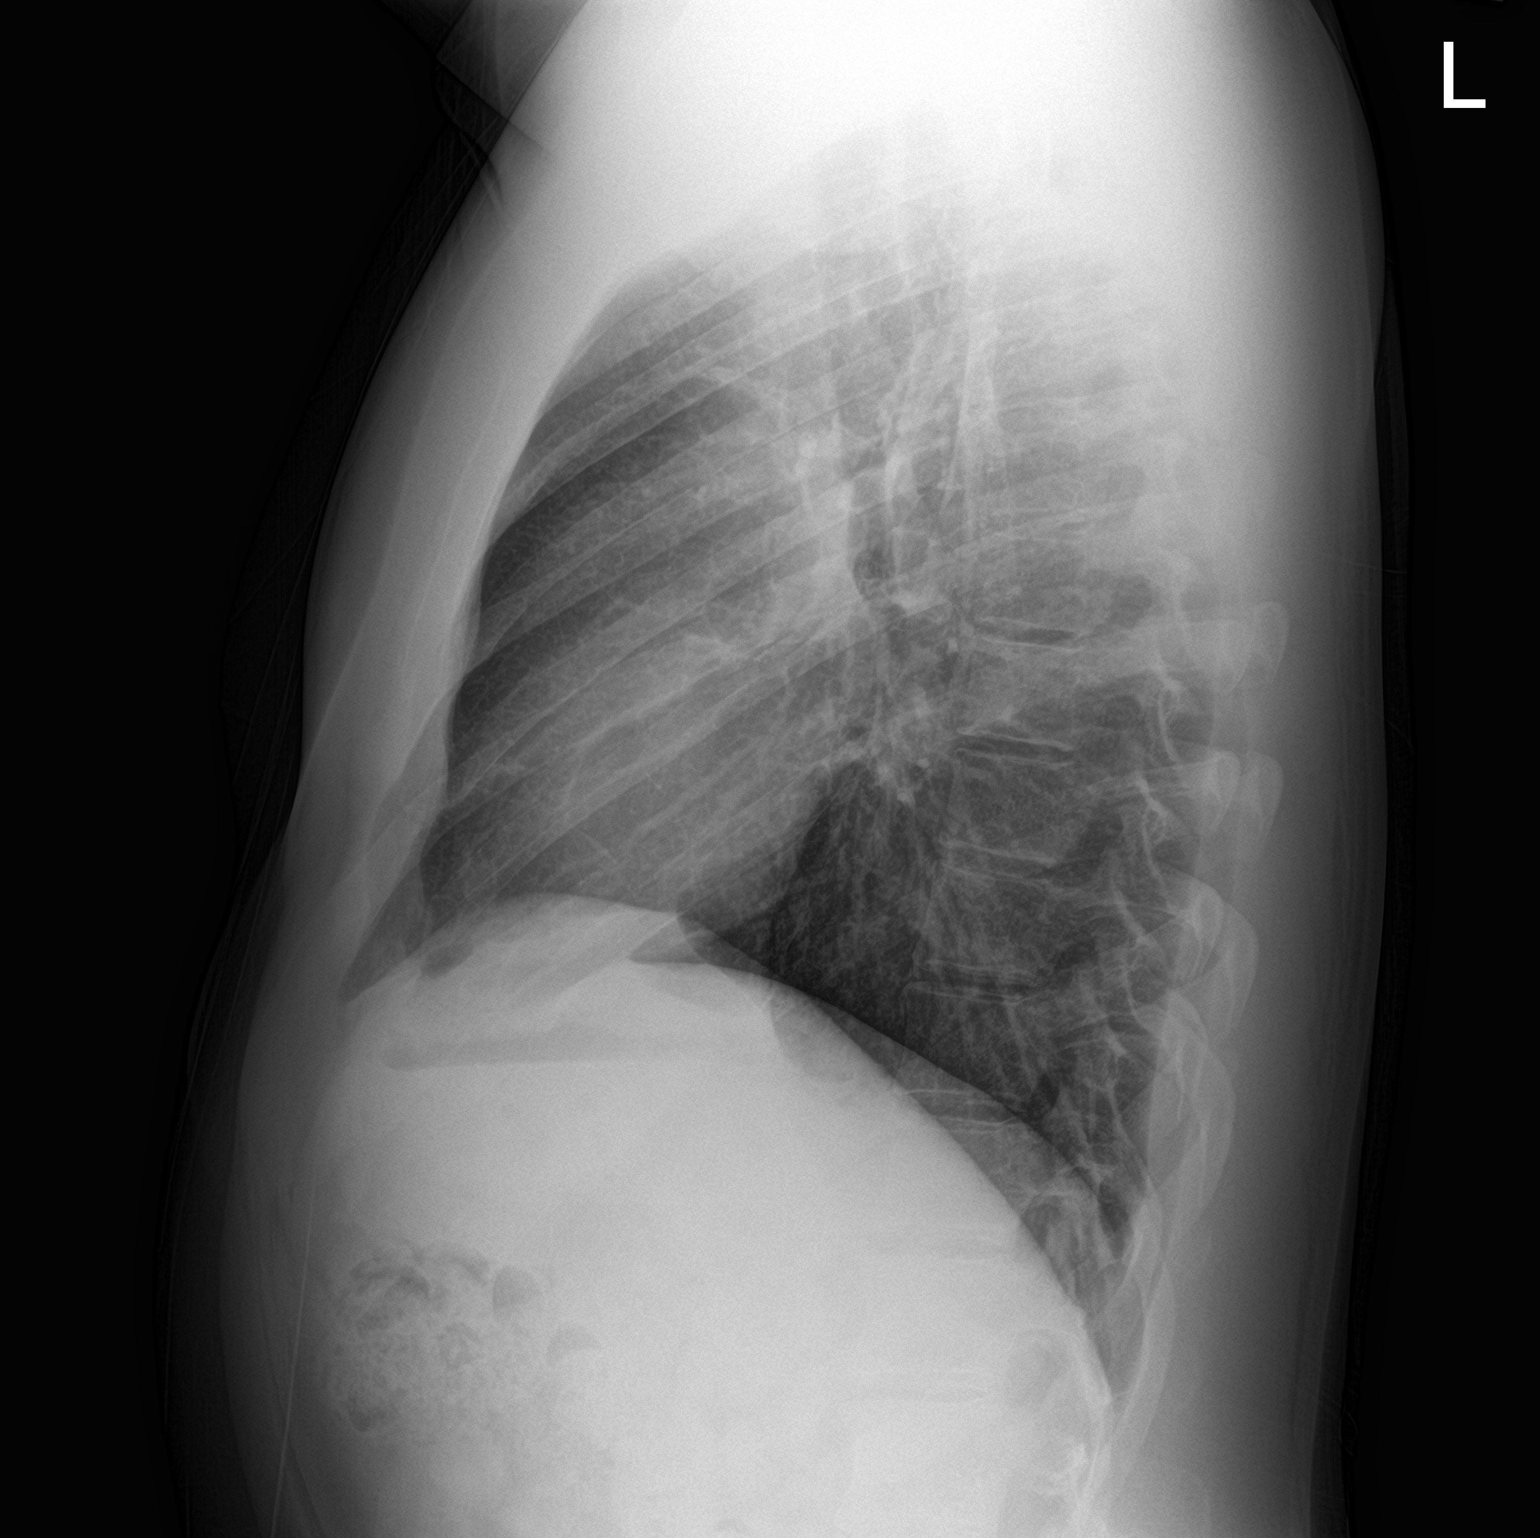

[2 of 2 positions shown; findings below may reference images not displayed]

FINDINGS: The heart size and mediastinal contours are within normal limits.
Both lungs are clear. No pleural effusion. The visualized skeletal
structures are unremarkable.
IMPRESSION: No active cardiopulmonary disease.

## 2022-03-05 DIAGNOSIS — M542 Cervicalgia: Secondary | ICD-10-CM | POA: Diagnosis not present

## 2022-03-05 DIAGNOSIS — M25512 Pain in left shoulder: Secondary | ICD-10-CM | POA: Diagnosis not present

## 2022-03-05 DIAGNOSIS — M62830 Muscle spasm of back: Secondary | ICD-10-CM | POA: Diagnosis not present

## 2022-03-05 DIAGNOSIS — M5417 Radiculopathy, lumbosacral region: Secondary | ICD-10-CM | POA: Diagnosis not present

## 2022-04-16 DIAGNOSIS — M25512 Pain in left shoulder: Secondary | ICD-10-CM | POA: Diagnosis not present

## 2022-04-16 DIAGNOSIS — M542 Cervicalgia: Secondary | ICD-10-CM | POA: Diagnosis not present

## 2022-04-16 DIAGNOSIS — M62830 Muscle spasm of back: Secondary | ICD-10-CM | POA: Diagnosis not present

## 2022-04-16 DIAGNOSIS — M5417 Radiculopathy, lumbosacral region: Secondary | ICD-10-CM | POA: Diagnosis not present

## 2022-05-14 DIAGNOSIS — M25512 Pain in left shoulder: Secondary | ICD-10-CM | POA: Diagnosis not present

## 2022-05-14 DIAGNOSIS — M5417 Radiculopathy, lumbosacral region: Secondary | ICD-10-CM | POA: Diagnosis not present

## 2022-05-14 DIAGNOSIS — M62830 Muscle spasm of back: Secondary | ICD-10-CM | POA: Diagnosis not present

## 2022-05-14 DIAGNOSIS — M542 Cervicalgia: Secondary | ICD-10-CM | POA: Diagnosis not present

## 2022-06-11 IMAGING — CR DG CHEST 2V
2 series · 2 of 2 positions shown · non-contrast
Comparison: October 21, 2019

CLINICAL DATA: Left-sided anterior chest pain.

EXAM:
CHEST - 2 VIEW

[w chest pa]
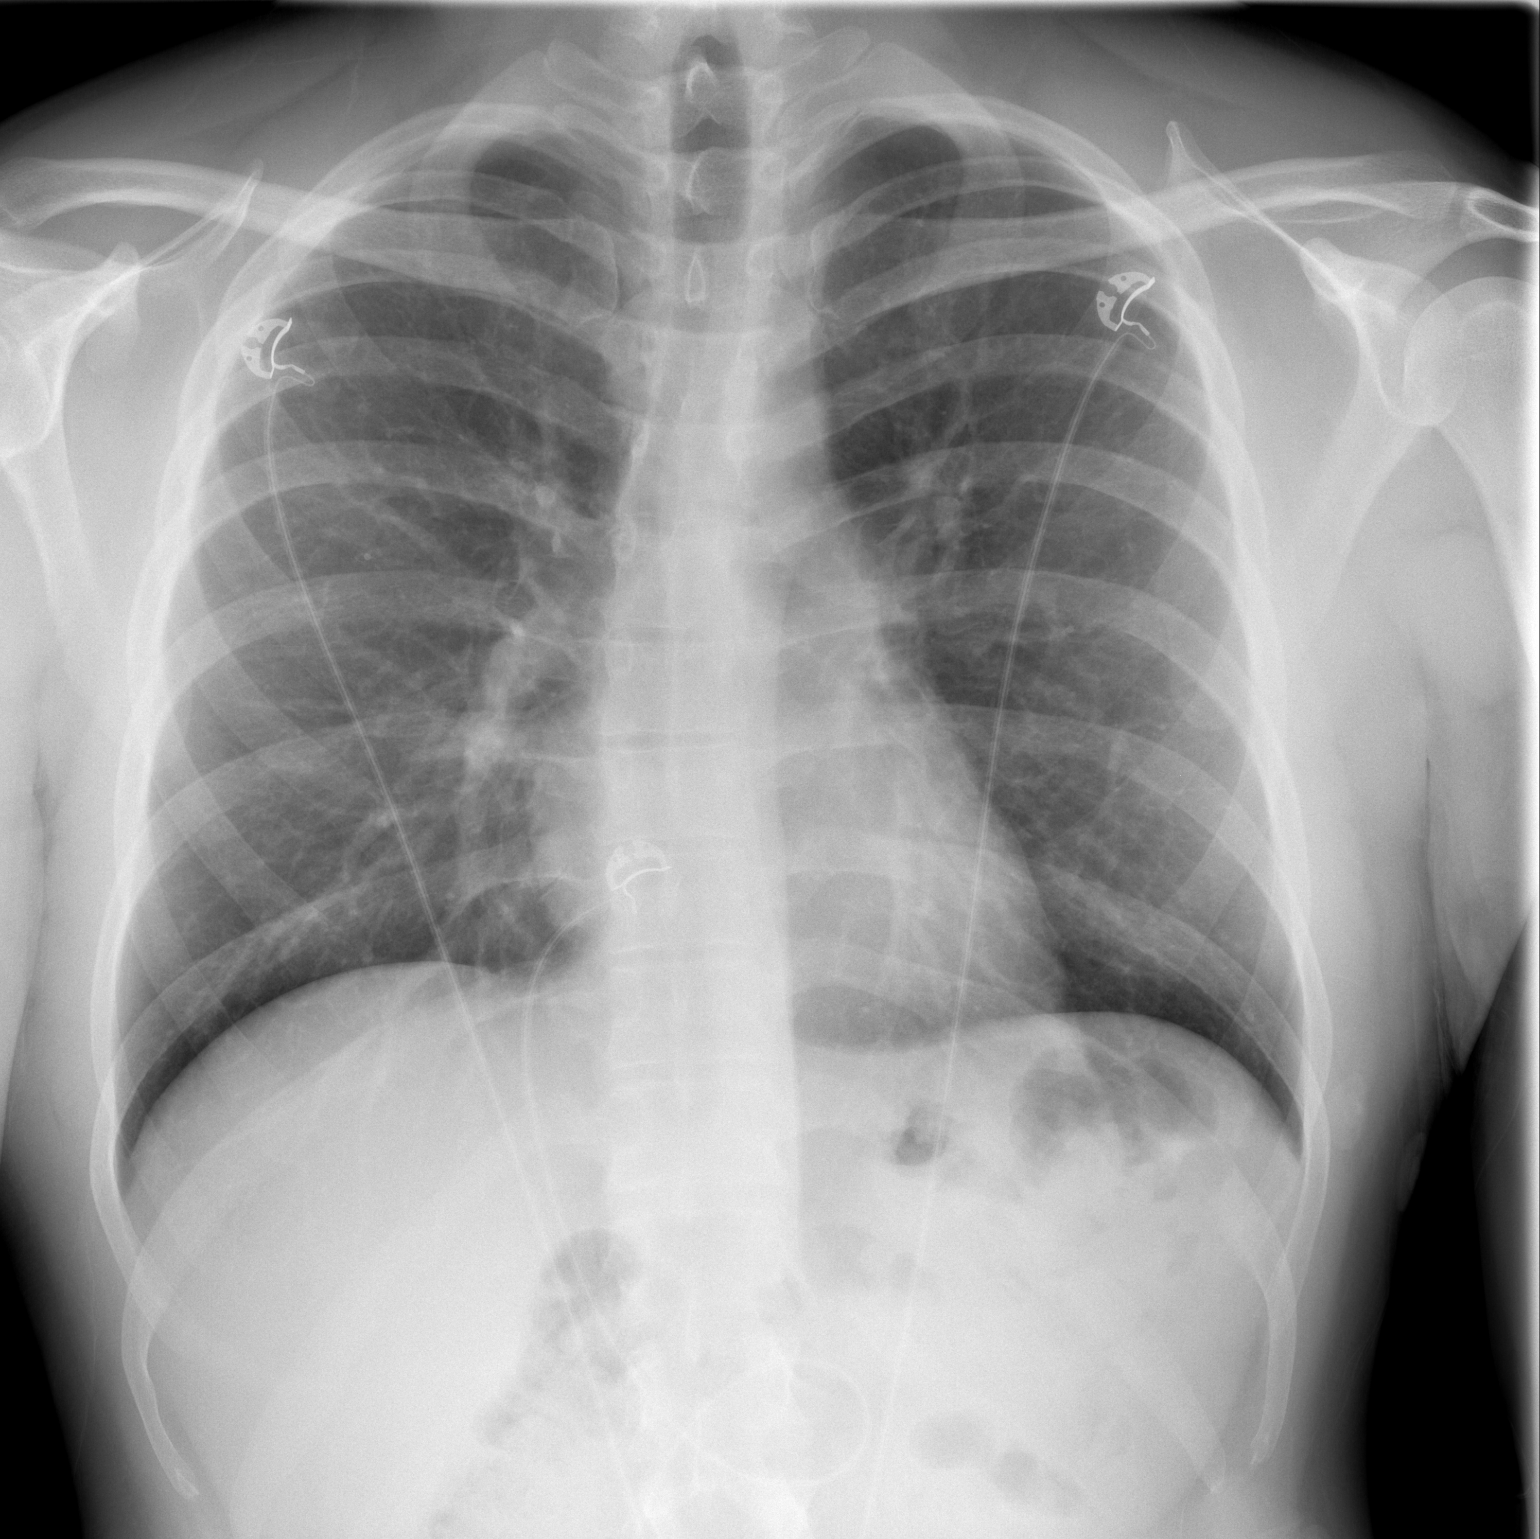

[w chest lat]
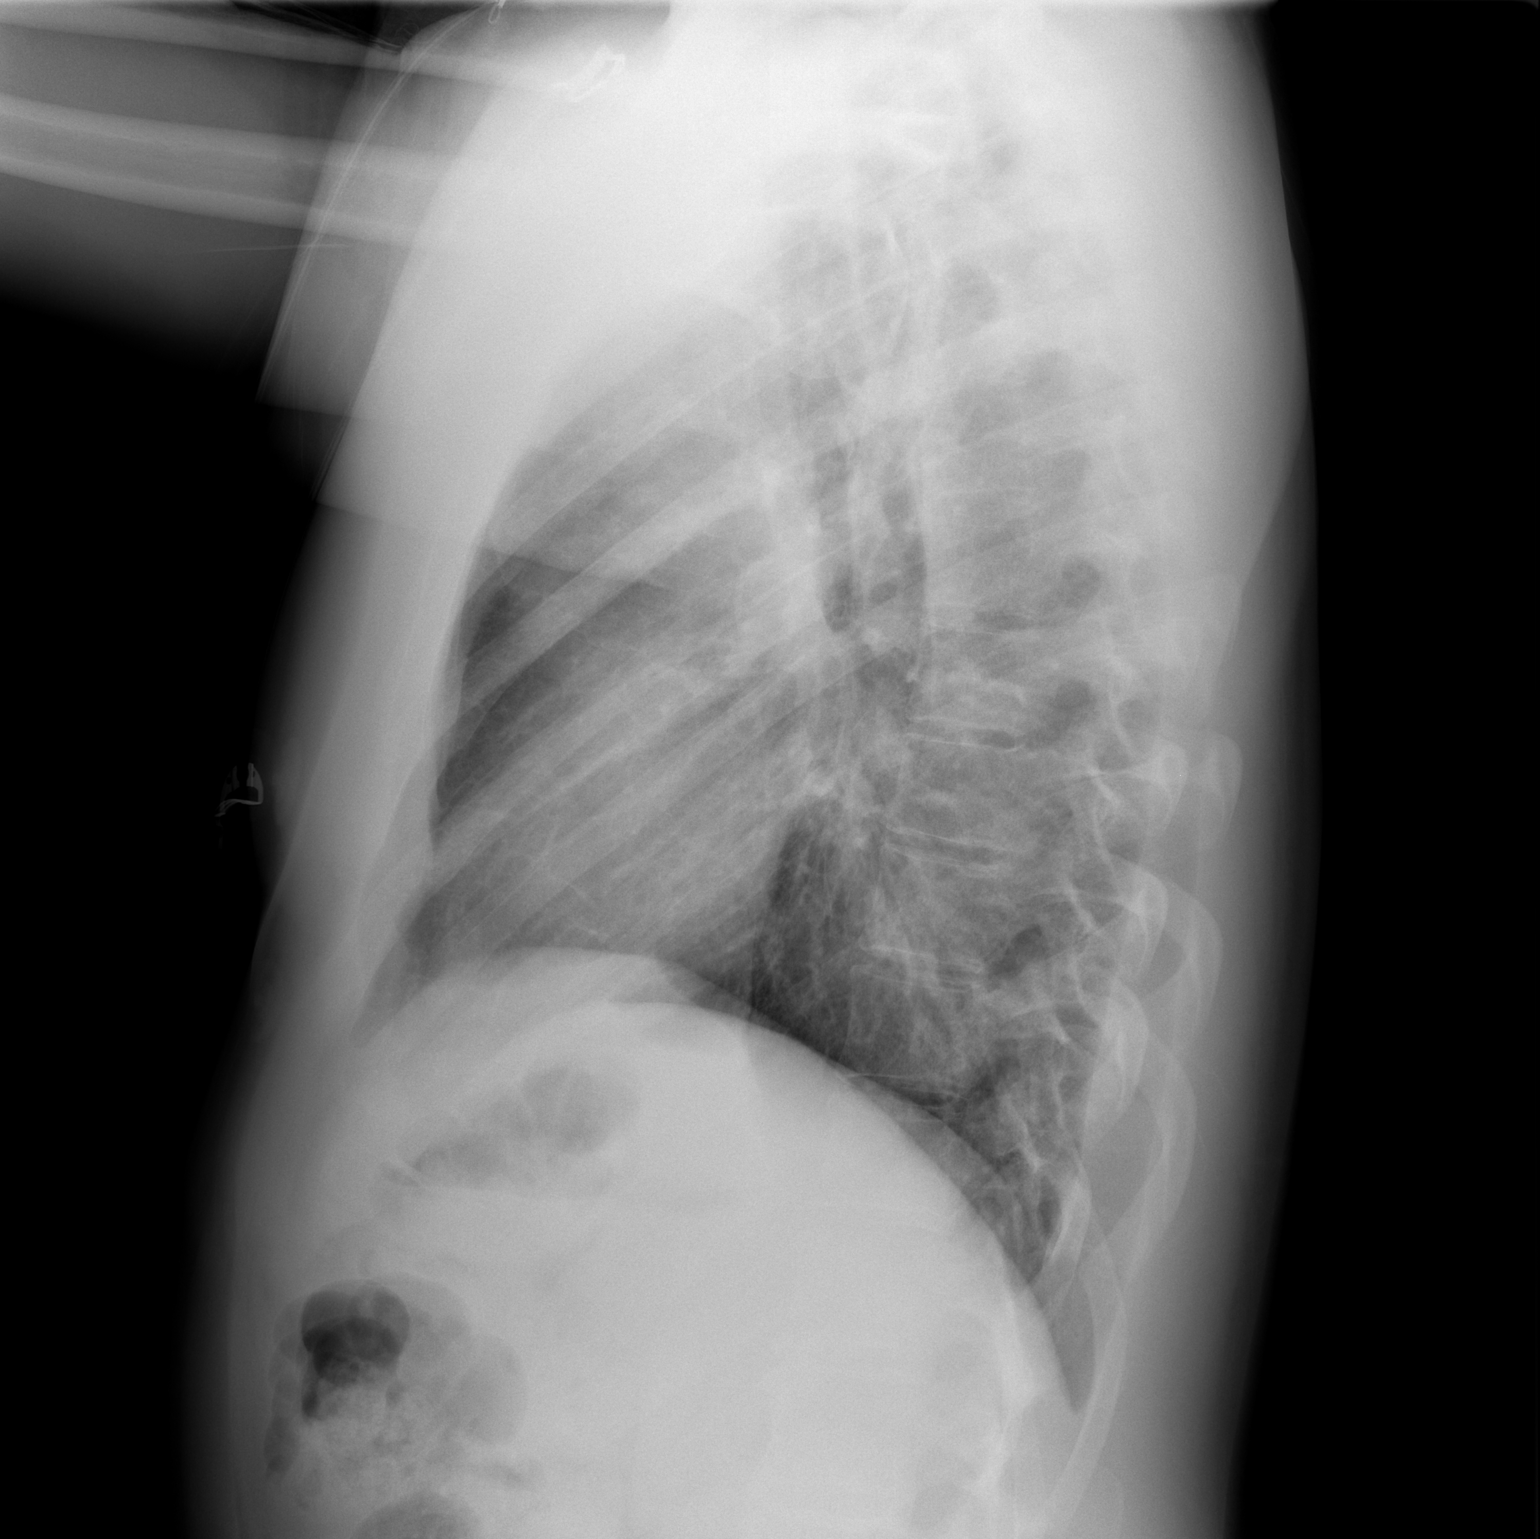

[2 of 2 positions shown; findings below may reference images not displayed]

FINDINGS: The heart size and mediastinal contours are within normal limits.
Both lungs are clear. The visualized skeletal structures are
unremarkable.
IMPRESSION: No active cardiopulmonary disease.

## 2022-08-07 DIAGNOSIS — M25512 Pain in left shoulder: Secondary | ICD-10-CM | POA: Diagnosis not present

## 2022-08-07 DIAGNOSIS — M5417 Radiculopathy, lumbosacral region: Secondary | ICD-10-CM | POA: Diagnosis not present

## 2022-08-07 DIAGNOSIS — M542 Cervicalgia: Secondary | ICD-10-CM | POA: Diagnosis not present

## 2022-08-07 DIAGNOSIS — M62830 Muscle spasm of back: Secondary | ICD-10-CM | POA: Diagnosis not present

## 2022-09-15 ENCOUNTER — Encounter: Payer: Self-pay | Admitting: *Deleted

## 2022-10-13 DIAGNOSIS — L648 Other androgenic alopecia: Secondary | ICD-10-CM | POA: Diagnosis not present

## 2022-10-13 DIAGNOSIS — B351 Tinea unguium: Secondary | ICD-10-CM | POA: Diagnosis not present

## 2022-10-21 DIAGNOSIS — M542 Cervicalgia: Secondary | ICD-10-CM | POA: Diagnosis not present

## 2022-10-21 DIAGNOSIS — M62830 Muscle spasm of back: Secondary | ICD-10-CM | POA: Diagnosis not present

## 2022-10-21 DIAGNOSIS — M5417 Radiculopathy, lumbosacral region: Secondary | ICD-10-CM | POA: Diagnosis not present

## 2022-10-21 DIAGNOSIS — M25512 Pain in left shoulder: Secondary | ICD-10-CM | POA: Diagnosis not present

## 2022-11-14 DIAGNOSIS — H33303 Unspecified retinal break, bilateral: Secondary | ICD-10-CM | POA: Diagnosis not present
